# Patient Record
Sex: Female | Born: 1970 | Race: Black or African American | Hispanic: No | Marital: Single | Smoking: Current every day smoker
Health system: Southern US, Community
[De-identification: ages and names within clinical notes are randomized; demographics above are authoritative.]

## PROBLEM LIST (undated history)

## (undated) DIAGNOSIS — Z789 Other specified health status: Secondary | ICD-10-CM

## (undated) DIAGNOSIS — J302 Other seasonal allergic rhinitis: Secondary | ICD-10-CM

## (undated) DIAGNOSIS — R011 Cardiac murmur, unspecified: Secondary | ICD-10-CM

## (undated) DIAGNOSIS — L309 Dermatitis, unspecified: Secondary | ICD-10-CM

## (undated) DIAGNOSIS — M719 Bursopathy, unspecified: Secondary | ICD-10-CM

## (undated) DIAGNOSIS — D841 Defects in the complement system: Secondary | ICD-10-CM

## (undated) HISTORY — DX: Defects in the complement system: D84.1

## (undated) HISTORY — DX: Bursopathy, unspecified: M71.9

## (undated) HISTORY — PX: NO PAST SURGERIES: SHX2092

## (undated) HISTORY — DX: Cardiac murmur, unspecified: R01.1

---

## 1998-04-02 ENCOUNTER — Emergency Department (HOSPITAL_COMMUNITY): Admission: EM | Admit: 1998-04-02 | Discharge: 1998-04-02 | Payer: Self-pay | Admitting: Emergency Medicine

## 1998-05-07 ENCOUNTER — Emergency Department (HOSPITAL_COMMUNITY): Admission: EM | Admit: 1998-05-07 | Discharge: 1998-05-07 | Payer: Self-pay

## 1998-06-29 ENCOUNTER — Emergency Department (HOSPITAL_COMMUNITY): Admission: EM | Admit: 1998-06-29 | Discharge: 1998-06-29 | Payer: Self-pay | Admitting: Emergency Medicine

## 1998-08-28 ENCOUNTER — Other Ambulatory Visit: Admission: RE | Admit: 1998-08-28 | Discharge: 1998-08-28 | Payer: Self-pay | Admitting: Gynecology

## 1998-11-22 ENCOUNTER — Emergency Department (HOSPITAL_COMMUNITY): Admission: EM | Admit: 1998-11-22 | Discharge: 1998-11-22 | Payer: Self-pay | Admitting: Emergency Medicine

## 1998-11-23 ENCOUNTER — Emergency Department (HOSPITAL_COMMUNITY): Admission: EM | Admit: 1998-11-23 | Discharge: 1998-11-23 | Payer: Self-pay | Admitting: *Deleted

## 1998-12-04 ENCOUNTER — Emergency Department (HOSPITAL_COMMUNITY): Admission: EM | Admit: 1998-12-04 | Discharge: 1998-12-04 | Payer: Self-pay | Admitting: Emergency Medicine

## 1999-01-25 ENCOUNTER — Emergency Department (HOSPITAL_COMMUNITY): Admission: EM | Admit: 1999-01-25 | Discharge: 1999-01-25 | Payer: Self-pay | Admitting: *Deleted

## 1999-03-08 ENCOUNTER — Encounter: Payer: Self-pay | Admitting: Emergency Medicine

## 1999-03-08 ENCOUNTER — Emergency Department (HOSPITAL_COMMUNITY): Admission: EM | Admit: 1999-03-08 | Discharge: 1999-03-08 | Payer: Self-pay

## 1999-03-28 ENCOUNTER — Emergency Department (HOSPITAL_COMMUNITY): Admission: EM | Admit: 1999-03-28 | Discharge: 1999-03-28 | Payer: Self-pay | Admitting: *Deleted

## 1999-04-02 ENCOUNTER — Ambulatory Visit: Admission: RE | Admit: 1999-04-02 | Discharge: 1999-04-02 | Payer: Self-pay | Admitting: Pulmonary Disease

## 1999-07-09 ENCOUNTER — Emergency Department (HOSPITAL_COMMUNITY): Admission: EM | Admit: 1999-07-09 | Discharge: 1999-07-09 | Payer: Self-pay | Admitting: Emergency Medicine

## 1999-07-11 ENCOUNTER — Emergency Department (HOSPITAL_COMMUNITY): Admission: EM | Admit: 1999-07-11 | Discharge: 1999-07-11 | Payer: Self-pay | Admitting: Emergency Medicine

## 1999-08-28 ENCOUNTER — Other Ambulatory Visit: Admission: RE | Admit: 1999-08-28 | Discharge: 1999-08-28 | Payer: Self-pay | Admitting: Obstetrics and Gynecology

## 1999-09-22 ENCOUNTER — Emergency Department (HOSPITAL_COMMUNITY): Admission: EM | Admit: 1999-09-22 | Discharge: 1999-09-22 | Payer: Self-pay

## 1999-12-12 ENCOUNTER — Emergency Department (HOSPITAL_COMMUNITY): Admission: EM | Admit: 1999-12-12 | Discharge: 1999-12-12 | Payer: Self-pay | Admitting: Emergency Medicine

## 2000-03-28 ENCOUNTER — Emergency Department (HOSPITAL_COMMUNITY): Admission: EM | Admit: 2000-03-28 | Discharge: 2000-03-28 | Payer: Self-pay

## 2000-04-05 ENCOUNTER — Ambulatory Visit (HOSPITAL_COMMUNITY): Admission: RE | Admit: 2000-04-05 | Discharge: 2000-04-05 | Payer: Self-pay | Admitting: Pulmonary Disease

## 2000-04-05 ENCOUNTER — Encounter: Payer: Self-pay | Admitting: Pulmonary Disease

## 2000-08-29 ENCOUNTER — Other Ambulatory Visit: Admission: RE | Admit: 2000-08-29 | Discharge: 2000-08-29 | Payer: Self-pay | Admitting: Gynecology

## 2000-09-15 ENCOUNTER — Emergency Department (HOSPITAL_COMMUNITY): Admission: EM | Admit: 2000-09-15 | Discharge: 2000-09-15 | Payer: Self-pay | Admitting: Internal Medicine

## 2000-09-15 ENCOUNTER — Encounter: Payer: Self-pay | Admitting: Internal Medicine

## 2001-01-16 ENCOUNTER — Ambulatory Visit (HOSPITAL_COMMUNITY): Admission: RE | Admit: 2001-01-16 | Discharge: 2001-01-16 | Payer: Self-pay | Admitting: Psychiatry

## 2001-03-15 ENCOUNTER — Emergency Department (HOSPITAL_COMMUNITY): Admission: EM | Admit: 2001-03-15 | Discharge: 2001-03-15 | Payer: Self-pay | Admitting: *Deleted

## 2001-08-16 ENCOUNTER — Emergency Department (HOSPITAL_COMMUNITY): Admission: EM | Admit: 2001-08-16 | Discharge: 2001-08-16 | Payer: Self-pay | Admitting: Emergency Medicine

## 2001-08-17 ENCOUNTER — Emergency Department (HOSPITAL_COMMUNITY): Admission: EM | Admit: 2001-08-17 | Discharge: 2001-08-17 | Payer: Self-pay | Admitting: Emergency Medicine

## 2001-08-20 ENCOUNTER — Encounter: Payer: Self-pay | Admitting: Emergency Medicine

## 2001-08-20 ENCOUNTER — Emergency Department (HOSPITAL_COMMUNITY): Admission: EM | Admit: 2001-08-20 | Discharge: 2001-08-20 | Payer: Self-pay | Admitting: Emergency Medicine

## 2001-09-06 ENCOUNTER — Other Ambulatory Visit: Admission: RE | Admit: 2001-09-06 | Discharge: 2001-09-06 | Payer: Self-pay | Admitting: *Deleted

## 2001-10-09 ENCOUNTER — Emergency Department (HOSPITAL_COMMUNITY): Admission: EM | Admit: 2001-10-09 | Discharge: 2001-10-09 | Payer: Self-pay

## 2001-11-10 ENCOUNTER — Encounter: Admission: RE | Admit: 2001-11-10 | Discharge: 2001-11-10 | Payer: Self-pay | Admitting: Psychiatry

## 2001-11-21 ENCOUNTER — Encounter: Payer: Self-pay | Admitting: Emergency Medicine

## 2001-11-21 ENCOUNTER — Emergency Department (HOSPITAL_COMMUNITY): Admission: EM | Admit: 2001-11-21 | Discharge: 2001-11-21 | Payer: Self-pay | Admitting: Emergency Medicine

## 2002-01-22 ENCOUNTER — Emergency Department (HOSPITAL_COMMUNITY): Admission: EM | Admit: 2002-01-22 | Discharge: 2002-01-22 | Payer: Self-pay | Admitting: Emergency Medicine

## 2002-01-27 ENCOUNTER — Emergency Department (HOSPITAL_COMMUNITY): Admission: EM | Admit: 2002-01-27 | Discharge: 2002-01-27 | Payer: Self-pay | Admitting: *Deleted

## 2002-07-27 ENCOUNTER — Emergency Department (HOSPITAL_COMMUNITY): Admission: EM | Admit: 2002-07-27 | Discharge: 2002-07-27 | Payer: Self-pay | Admitting: Emergency Medicine

## 2002-09-07 ENCOUNTER — Other Ambulatory Visit: Admission: RE | Admit: 2002-09-07 | Discharge: 2002-09-07 | Payer: Self-pay | Admitting: Gynecology

## 2002-12-27 ENCOUNTER — Emergency Department (HOSPITAL_COMMUNITY): Admission: EM | Admit: 2002-12-27 | Discharge: 2002-12-27 | Payer: Self-pay | Admitting: Emergency Medicine

## 2003-02-16 ENCOUNTER — Emergency Department (HOSPITAL_COMMUNITY): Admission: EM | Admit: 2003-02-16 | Discharge: 2003-02-16 | Payer: Self-pay

## 2003-10-04 ENCOUNTER — Other Ambulatory Visit: Admission: RE | Admit: 2003-10-04 | Discharge: 2003-10-04 | Payer: Self-pay | Admitting: Gynecology

## 2003-10-27 ENCOUNTER — Emergency Department (HOSPITAL_COMMUNITY): Admission: EM | Admit: 2003-10-27 | Discharge: 2003-10-27 | Payer: Self-pay | Admitting: Emergency Medicine

## 2003-10-31 ENCOUNTER — Emergency Department (HOSPITAL_COMMUNITY): Admission: EM | Admit: 2003-10-31 | Discharge: 2003-10-31 | Payer: Self-pay | Admitting: Emergency Medicine

## 2004-10-07 ENCOUNTER — Other Ambulatory Visit: Admission: RE | Admit: 2004-10-07 | Discharge: 2004-10-07 | Payer: Self-pay | Admitting: Gynecology

## 2004-12-16 ENCOUNTER — Emergency Department (HOSPITAL_COMMUNITY): Admission: EM | Admit: 2004-12-16 | Discharge: 2004-12-16 | Payer: Self-pay | Admitting: Emergency Medicine

## 2004-12-18 ENCOUNTER — Emergency Department (HOSPITAL_COMMUNITY): Admission: EM | Admit: 2004-12-18 | Discharge: 2004-12-19 | Payer: Self-pay | Admitting: Emergency Medicine

## 2005-10-11 ENCOUNTER — Emergency Department (HOSPITAL_COMMUNITY): Admission: EM | Admit: 2005-10-11 | Discharge: 2005-10-11 | Payer: Self-pay | Admitting: Emergency Medicine

## 2005-10-13 ENCOUNTER — Other Ambulatory Visit: Admission: RE | Admit: 2005-10-13 | Discharge: 2005-10-13 | Payer: Self-pay | Admitting: Gynecology

## 2006-09-12 ENCOUNTER — Emergency Department (HOSPITAL_COMMUNITY): Admission: EM | Admit: 2006-09-12 | Discharge: 2006-09-13 | Payer: Self-pay | Admitting: Emergency Medicine

## 2006-10-19 ENCOUNTER — Other Ambulatory Visit: Admission: RE | Admit: 2006-10-19 | Discharge: 2006-10-19 | Payer: Self-pay | Admitting: Gynecology

## 2006-11-14 ENCOUNTER — Emergency Department (HOSPITAL_COMMUNITY): Admission: EM | Admit: 2006-11-14 | Discharge: 2006-11-14 | Payer: Self-pay | Admitting: Emergency Medicine

## 2007-03-17 ENCOUNTER — Emergency Department (HOSPITAL_COMMUNITY): Admission: EM | Admit: 2007-03-17 | Discharge: 2007-03-17 | Payer: Self-pay | Admitting: Emergency Medicine

## 2007-04-13 ENCOUNTER — Emergency Department (HOSPITAL_COMMUNITY): Admission: EM | Admit: 2007-04-13 | Discharge: 2007-04-13 | Payer: Self-pay | Admitting: Emergency Medicine

## 2007-09-28 ENCOUNTER — Emergency Department (HOSPITAL_COMMUNITY): Admission: EM | Admit: 2007-09-28 | Discharge: 2007-09-29 | Payer: Self-pay | Admitting: Emergency Medicine

## 2007-11-09 ENCOUNTER — Other Ambulatory Visit: Admission: RE | Admit: 2007-11-09 | Discharge: 2007-11-09 | Payer: Self-pay | Admitting: Gynecology

## 2008-04-01 ENCOUNTER — Ambulatory Visit (HOSPITAL_COMMUNITY): Admission: RE | Admit: 2008-04-01 | Discharge: 2008-04-01 | Payer: Self-pay | Admitting: Internal Medicine

## 2008-04-15 ENCOUNTER — Emergency Department (HOSPITAL_BASED_OUTPATIENT_CLINIC_OR_DEPARTMENT_OTHER): Admission: EM | Admit: 2008-04-15 | Discharge: 2008-04-15 | Payer: Self-pay | Admitting: Emergency Medicine

## 2008-04-19 ENCOUNTER — Emergency Department (HOSPITAL_BASED_OUTPATIENT_CLINIC_OR_DEPARTMENT_OTHER): Admission: EM | Admit: 2008-04-19 | Discharge: 2008-04-19 | Payer: Self-pay | Admitting: Emergency Medicine

## 2008-06-05 ENCOUNTER — Ambulatory Visit (HOSPITAL_COMMUNITY): Admission: RE | Admit: 2008-06-05 | Discharge: 2008-06-05 | Payer: Self-pay | Admitting: Pulmonary Disease

## 2008-06-18 ENCOUNTER — Emergency Department (HOSPITAL_BASED_OUTPATIENT_CLINIC_OR_DEPARTMENT_OTHER): Admission: EM | Admit: 2008-06-18 | Discharge: 2008-06-18 | Payer: Self-pay | Admitting: Emergency Medicine

## 2008-12-06 ENCOUNTER — Ambulatory Visit: Payer: Self-pay | Admitting: Women's Health

## 2008-12-06 ENCOUNTER — Encounter: Payer: Self-pay | Admitting: Women's Health

## 2008-12-06 ENCOUNTER — Other Ambulatory Visit: Admission: RE | Admit: 2008-12-06 | Discharge: 2008-12-06 | Payer: Self-pay | Admitting: Gynecology

## 2008-12-13 ENCOUNTER — Other Ambulatory Visit: Admission: RE | Admit: 2008-12-13 | Discharge: 2008-12-13 | Payer: Self-pay | Admitting: Gynecology

## 2008-12-13 ENCOUNTER — Ambulatory Visit: Payer: Self-pay | Admitting: Women's Health

## 2008-12-13 ENCOUNTER — Ambulatory Visit (HOSPITAL_COMMUNITY): Admission: RE | Admit: 2008-12-13 | Discharge: 2008-12-13 | Payer: Self-pay | Admitting: Gastroenterology

## 2008-12-16 ENCOUNTER — Ambulatory Visit (HOSPITAL_COMMUNITY): Admission: RE | Admit: 2008-12-16 | Discharge: 2008-12-16 | Payer: Self-pay | Admitting: Gastroenterology

## 2009-03-18 ENCOUNTER — Ambulatory Visit: Payer: Self-pay | Admitting: Diagnostic Radiology

## 2009-03-18 ENCOUNTER — Emergency Department (HOSPITAL_BASED_OUTPATIENT_CLINIC_OR_DEPARTMENT_OTHER): Admission: EM | Admit: 2009-03-18 | Discharge: 2009-03-18 | Payer: Self-pay | Admitting: Emergency Medicine

## 2009-03-18 ENCOUNTER — Emergency Department (HOSPITAL_COMMUNITY): Admission: EM | Admit: 2009-03-18 | Discharge: 2009-03-18 | Payer: Self-pay | Admitting: Emergency Medicine

## 2009-03-19 ENCOUNTER — Emergency Department (HOSPITAL_BASED_OUTPATIENT_CLINIC_OR_DEPARTMENT_OTHER): Admission: EM | Admit: 2009-03-19 | Discharge: 2009-03-19 | Payer: Self-pay | Admitting: Emergency Medicine

## 2009-03-24 DIAGNOSIS — D841 Defects in the complement system: Secondary | ICD-10-CM

## 2009-03-24 HISTORY — DX: Defects in the complement system: D84.1

## 2009-05-05 ENCOUNTER — Ambulatory Visit: Payer: Self-pay | Admitting: Women's Health

## 2009-11-19 ENCOUNTER — Ambulatory Visit (HOSPITAL_COMMUNITY): Admission: RE | Admit: 2009-11-19 | Discharge: 2009-11-19 | Payer: Self-pay | Admitting: Gynecology

## 2009-12-20 ENCOUNTER — Emergency Department (HOSPITAL_COMMUNITY): Admission: EM | Admit: 2009-12-20 | Discharge: 2009-12-20 | Payer: Self-pay | Admitting: Emergency Medicine

## 2009-12-22 ENCOUNTER — Emergency Department (HOSPITAL_BASED_OUTPATIENT_CLINIC_OR_DEPARTMENT_OTHER): Admission: EM | Admit: 2009-12-22 | Discharge: 2009-12-22 | Payer: Self-pay | Admitting: Emergency Medicine

## 2009-12-22 ENCOUNTER — Ambulatory Visit: Payer: Self-pay | Admitting: Interventional Radiology

## 2010-01-30 ENCOUNTER — Emergency Department (HOSPITAL_BASED_OUTPATIENT_CLINIC_OR_DEPARTMENT_OTHER): Admission: EM | Admit: 2010-01-30 | Discharge: 2010-01-30 | Payer: Self-pay | Admitting: Emergency Medicine

## 2010-08-06 LAB — COMPREHENSIVE METABOLIC PANEL
AST: 24 U/L (ref 0–37)
Albumin: 4.6 g/dL (ref 3.5–5.2)
Alkaline Phosphatase: 89 U/L (ref 39–117)
CO2: 28 mEq/L (ref 19–32)
GFR calc Af Amer: >60 mL/min (ref >60)
GFR calc non Af Amer: >60 mL/min (ref >60)
Sodium: 138 mEq/L (ref 135–145)

## 2010-08-06 LAB — CBC
HCT: 49.3 % — ABNORMAL HIGH (ref 36.0–46.0)
Hemoglobin: 16.8 g/dL — ABNORMAL HIGH (ref 12.0–15.0)
MCHC: 34.1 g/dL (ref 30.0–36.0)
MCV: 104.3 fL — ABNORMAL HIGH (ref 78.0–100.0)
Platelets: 199 10*3/uL (ref 150–400)
RBC: 4.72 MIL/uL (ref 3.87–5.11)
RDW: 13.1 % (ref 11.5–15.5)

## 2010-08-06 LAB — DIFFERENTIAL
Basophils Relative: 1 % (ref 0–1)
Eosinophils Absolute: 0.1 10*3/uL (ref 0.0–0.7)
Lymphs Abs: 1.4 10*3/uL (ref 0.7–4.0)
Monocytes Absolute: 1 10*3/uL (ref 0.1–1.0)
Neutro Abs: 5 10*3/uL (ref 1.7–7.7)
Neutrophils Relative %: 68 % (ref 43–77)

## 2010-08-06 LAB — URINALYSIS, ROUTINE W REFLEX MICROSCOPIC
Leukocytes, UA: NEGATIVE
Nitrite: NEGATIVE
Protein, ur: 100 mg/dL — AB
Urobilinogen, UA: 1 mg/dL (ref 0.0–1.0)

## 2010-08-06 LAB — URINE MICROSCOPIC-ADD ON

## 2010-08-07 LAB — URINALYSIS, ROUTINE W REFLEX MICROSCOPIC
Glucose, UA: NEGATIVE mg/dL
Leukocytes, UA: NEGATIVE
Nitrite: NEGATIVE
pH: 7 (ref 5.0–8.0)

## 2010-08-07 LAB — COMPREHENSIVE METABOLIC PANEL
ALT: 15 U/L (ref 0–35)
AST: 21 U/L (ref 0–37)
Albumin: 4.2 g/dL (ref 3.5–5.2)
CO2: 28 mEq/L (ref 19–32)
Chloride: 101 mEq/L (ref 96–112)
Creatinine, Ser: 0.6 mg/dL (ref 0.4–1.2)
GFR calc non Af Amer: >60 mL/min (ref >60)
Glucose, Bld: 83 mg/dL (ref 70–99)
Potassium: 3.9 mEq/L (ref 3.5–5.1)
Total Protein: 8.2 g/dL (ref 6.0–8.3)

## 2010-08-07 LAB — URINE MICROSCOPIC-ADD ON

## 2010-08-07 LAB — LIPASE, BLOOD: Lipase: 39 U/L (ref 23–300)

## 2010-08-08 LAB — POCT I-STAT, CHEM 8
Glucose, Bld: 123 mg/dL — ABNORMAL HIGH (ref 70–99)
HCT: 47 % — ABNORMAL HIGH (ref 36.0–46.0)
Hemoglobin: 16 g/dL — ABNORMAL HIGH (ref 12.0–15.0)
Potassium: 3.6 mEq/L (ref 3.5–5.1)
Sodium: 143 mEq/L (ref 135–145)
TCO2: 23 mmol/L (ref 0–100)

## 2010-08-08 LAB — URINALYSIS, ROUTINE W REFLEX MICROSCOPIC
Hgb urine dipstick: NEGATIVE
Ketones, ur: >80 mg/dL — AB
Nitrite: NEGATIVE
Specific Gravity, Urine: 1.017 (ref 1.005–1.030)

## 2010-08-08 LAB — URINE MICROSCOPIC-ADD ON

## 2010-08-12 ENCOUNTER — Emergency Department (HOSPITAL_BASED_OUTPATIENT_CLINIC_OR_DEPARTMENT_OTHER)
Admission: EM | Admit: 2010-08-12 | Discharge: 2010-08-12 | Disposition: A | Discharge status: Home / Self Care | Payer: Federal, State, Local not specified - PPO | Attending: Emergency Medicine | Admitting: Emergency Medicine

## 2010-08-12 DIAGNOSIS — J45909 Unspecified asthma, uncomplicated: Secondary | ICD-10-CM | POA: Insufficient documentation

## 2010-08-12 DIAGNOSIS — R112 Nausea with vomiting, unspecified: Secondary | ICD-10-CM | POA: Insufficient documentation

## 2010-08-12 LAB — URINALYSIS, ROUTINE W REFLEX MICROSCOPIC
Glucose, UA: NEGATIVE mg/dL
Ketones, ur: >80 mg/dL — AB
Leukocytes, UA: NEGATIVE
Nitrite: NEGATIVE
pH: 8.5 — ABNORMAL HIGH (ref 5.0–8.0)

## 2010-08-12 LAB — DIFFERENTIAL
Basophils Relative: 0 % (ref 0–1)
Eosinophils Absolute: 0 10*3/uL (ref 0.0–0.7)
Eosinophils Relative: 0 % (ref 0–5)
Lymphs Abs: 0.8 10*3/uL (ref 0.7–4.0)
Monocytes Absolute: 0.5 10*3/uL (ref 0.1–1.0)
Monocytes Relative: 4 % (ref 3–12)
Neutrophils Relative %: 89 % — ABNORMAL HIGH (ref 43–77)

## 2010-08-12 LAB — CBC
MCH: 35.7 pg — ABNORMAL HIGH (ref 26.0–34.0)
MCHC: 35.8 g/dL (ref 30.0–36.0)
MCV: 99.7 fL (ref 78.0–100.0)
Platelets: 194 10*3/uL (ref 150–400)
RBC: 3.92 MIL/uL (ref 3.87–5.11)
RDW: 11.8 % (ref 11.5–15.5)

## 2010-08-12 LAB — COMPREHENSIVE METABOLIC PANEL
AST: 32 U/L (ref 0–37)
Albumin: 5 g/dL (ref 3.5–5.2)
BUN: 9 mg/dL (ref 6–23)
Calcium: 9.9 mg/dL (ref 8.4–10.5)
Chloride: 109 mEq/L (ref 96–112)
Creatinine, Ser: 0.6 mg/dL (ref 0.4–1.2)
GFR calc Af Amer: >60 mL/min (ref >60)
Total Bilirubin: 1.8 mg/dL — ABNORMAL HIGH (ref 0.3–1.2)
Total Protein: 9 g/dL — ABNORMAL HIGH (ref 6.0–8.3)

## 2010-08-12 LAB — URINE MICROSCOPIC-ADD ON

## 2010-08-12 LAB — PREGNANCY, URINE: Preg Test, Ur: NEGATIVE

## 2010-08-14 ENCOUNTER — Emergency Department (HOSPITAL_BASED_OUTPATIENT_CLINIC_OR_DEPARTMENT_OTHER)
Admission: EM | Admit: 2010-08-14 | Discharge: 2010-08-14 | Disposition: A | Discharge status: Home / Self Care | Payer: Federal, State, Local not specified - PPO | Attending: Emergency Medicine | Admitting: Emergency Medicine

## 2010-08-14 ENCOUNTER — Emergency Department (INDEPENDENT_AMBULATORY_CARE_PROVIDER_SITE_OTHER): Payer: Federal, State, Local not specified - PPO

## 2010-08-14 DIAGNOSIS — J45909 Unspecified asthma, uncomplicated: Secondary | ICD-10-CM | POA: Insufficient documentation

## 2010-08-14 DIAGNOSIS — R112 Nausea with vomiting, unspecified: Secondary | ICD-10-CM | POA: Insufficient documentation

## 2010-08-14 LAB — CBC
MCH: 35.3 pg — ABNORMAL HIGH (ref 26.0–34.0)
MCV: 97.9 fL (ref 78.0–100.0)
Platelets: 208 10*3/uL (ref 150–400)
RDW: 11.9 % (ref 11.5–15.5)
WBC: 10.7 10*3/uL — ABNORMAL HIGH (ref 4.0–10.5)

## 2010-08-14 LAB — COMPREHENSIVE METABOLIC PANEL
AST: 42 U/L — ABNORMAL HIGH (ref 0–37)
Albumin: 5.6 g/dL — ABNORMAL HIGH (ref 3.5–5.2)
BUN: 11 mg/dL (ref 6–23)
Creatinine, Ser: 0.6 mg/dL (ref 0.4–1.2)
GFR calc Af Amer: >60 mL/min (ref >60)
Total Protein: 10.1 g/dL — ABNORMAL HIGH (ref 6.0–8.3)

## 2010-08-14 LAB — DIFFERENTIAL
Basophils Relative: 0 % (ref 0–1)
Eosinophils Absolute: 0 10*3/uL (ref 0.0–0.7)
Eosinophils Relative: 0 % (ref 0–5)
Lymphs Abs: 1.4 10*3/uL (ref 0.7–4.0)
Monocytes Relative: 8 % (ref 3–12)

## 2010-08-14 MED ORDER — IOHEXOL 300 MG/ML  SOLN
100.0000 mL | Freq: Once | INTRAMUSCULAR | Status: AC | PRN
  Administered 2010-08-14: 100 mL via INTRAVENOUS

## 2011-01-21 DIAGNOSIS — M719 Bursopathy, unspecified: Secondary | ICD-10-CM | POA: Insufficient documentation

## 2011-01-21 DIAGNOSIS — J45901 Unspecified asthma with (acute) exacerbation: Secondary | ICD-10-CM | POA: Insufficient documentation

## 2011-01-21 DIAGNOSIS — J45909 Unspecified asthma, uncomplicated: Secondary | ICD-10-CM | POA: Insufficient documentation

## 2011-01-28 ENCOUNTER — Ambulatory Visit (INDEPENDENT_AMBULATORY_CARE_PROVIDER_SITE_OTHER): Payer: Federal, State, Local not specified - PPO | Admitting: Women's Health

## 2011-01-28 ENCOUNTER — Encounter: Payer: Self-pay | Admitting: Women's Health

## 2011-01-28 ENCOUNTER — Other Ambulatory Visit (HOSPITAL_COMMUNITY)
Admission: RE | Admit: 2011-01-28 | Discharge: 2011-01-28 | Disposition: A | Discharge status: Home / Self Care | Payer: Federal, State, Local not specified - PPO | Source: Ambulatory Visit | Attending: Obstetrics and Gynecology | Admitting: Obstetrics and Gynecology

## 2011-01-28 VITALS — BP 110/70 | Ht 64.25 in | Wt 147.0 lb

## 2011-01-28 DIAGNOSIS — Z01419 Encounter for gynecological examination (general) (routine) without abnormal findings: Secondary | ICD-10-CM

## 2011-01-28 NOTE — Progress Notes (Signed)
Barbara Espinoza April 26, 1971 [id number]    History:    The patient presents for annual exam.  Works for the [employer], finishing up BS degree.   Past medical history, past surgical history, family history and social history were all reviewed and documented in the EPIC chart.   ROS:  A  ROS was performed and pertinent positives and negatives are included in the history.  Exam:  Filed Vitals:   01/28/11 0927  BP: 110/70    General appearance:  Normal Head/Neck:  Normal, without cervical or supraclavicular adenopathy. Thyroid:  Symmetrical, normal in size, without palpable masses or nodularity. Respiratory  Effort:  Normal  Auscultation:  Clear without wheezing or rhonchi Cardiovascular  Auscultation:  Regular rate, without rubs, murmurs or gallops  Edema/varicosities:  Not grossly evident Abdominal  Soft,nontender, without masses, guarding or rebound.  Liver/spleen:  No organomegaly noted  Hernia:  None appreciated  Skin  Inspection:  Grossly normal  Palpation:  Grossly normal Neurologic/psychiatric  Orientation:  Normal with appropriate conversation.  Mood/affect:  Normal  Genitourinary    Breasts: Examined lying and sitting.     Right: Without masses, retractions, discharge or axillary adenopathy.     Left: Without masses, retractions, discharge or axillary adenopathy.   Inguinal/mons:  Normal without inguinal adenopathy  External genitalia:  Normal  BUS/Urethra/Skene's glands:  Normal  Bladder:  Normal  Vagina:  Normal  Cervix:  Normal  Uterus:   normal in size, shape and contour.  Midline and mobile  Adnexa/parametria:     Rt: Without masses or tenderness.   Lt: Without masses or tenderness.  Anus and perineum: Normal  Digital rectal exam: Normal sphincter tone without palpated masses or tenderness  Assessment/Plan:  40 y.o. SBF G0  for annual exam. Monthly 5 day cycle without complaint, not sexually active. Encouraged condoms when she becomes. Labs are done  at her primary care, Pap only today SBEs, she had a normal mammogram last year, instructed to schedule on now is due. Encouraged exercise, MVI, calcium rich diet,    Barbara Espinoza WHNP, 10:00 AM 01/28/2011

## 2011-02-07 ENCOUNTER — Emergency Department (INDEPENDENT_AMBULATORY_CARE_PROVIDER_SITE_OTHER): Payer: Federal, State, Local not specified - PPO

## 2011-02-07 ENCOUNTER — Encounter (HOSPITAL_BASED_OUTPATIENT_CLINIC_OR_DEPARTMENT_OTHER): Payer: Self-pay | Admitting: *Deleted

## 2011-02-07 ENCOUNTER — Emergency Department (HOSPITAL_BASED_OUTPATIENT_CLINIC_OR_DEPARTMENT_OTHER)
Admission: EM | Admit: 2011-02-07 | Discharge: 2011-02-07 | Disposition: A | Discharge status: Home / Self Care | Payer: Federal, State, Local not specified - PPO | Attending: Emergency Medicine | Admitting: Emergency Medicine

## 2011-02-07 DIAGNOSIS — R05 Cough: Secondary | ICD-10-CM

## 2011-02-07 DIAGNOSIS — R0989 Other specified symptoms and signs involving the circulatory and respiratory systems: Secondary | ICD-10-CM

## 2011-02-07 DIAGNOSIS — R062 Wheezing: Secondary | ICD-10-CM

## 2011-02-07 DIAGNOSIS — J45901 Unspecified asthma with (acute) exacerbation: Secondary | ICD-10-CM

## 2011-02-07 DIAGNOSIS — R059 Cough, unspecified: Secondary | ICD-10-CM

## 2011-02-07 MED ORDER — ALBUTEROL SULFATE (5 MG/ML) 0.5% IN NEBU
INHALATION_SOLUTION | RESPIRATORY_TRACT | Status: AC
  Administered 2011-02-07: 5 mg
  Filled 2011-02-07: qty 1

## 2011-02-07 MED ORDER — PREDNISONE 10 MG PO TABS: 40.0000 mg | ORAL_TABLET | Freq: Every day | ORAL | Status: AC

## 2011-02-07 MED ORDER — PREDNISONE 20 MG PO TABS
60.0000 mg | ORAL_TABLET | Freq: Once | ORAL | Status: AC
  Administered 2011-02-07: 60 mg via ORAL
  Filled 2011-02-07: qty 3

## 2011-02-07 MED ORDER — IPRATROPIUM BROMIDE 0.02 % IN SOLN
RESPIRATORY_TRACT | Status: AC
  Administered 2011-02-07: 0.5 mg
  Filled 2011-02-07: qty 2.5

## 2011-02-07 NOTE — ED Provider Notes (Signed)
History     CSN: [id number] Arrival date & time: 02/07/2011  1:56 PM   Chief Complaint  Patient presents with  . Asthma     (Include location/radiation/quality/duration/timing/severity/associated sxs/prior treatment) HPI Comments: Pt states that her inhaler was not helping at home  Patient is a 40 y.o. female presenting with wheezing. The history is provided by the patient. No language interpreter was used.  Wheezing  The current episode started yesterday. The onset was gradual. The problem occurs continuously. The problem has been unchanged. The problem is moderate. The symptoms are relieved by beta-agonist inhalers. The symptoms are aggravated by nothing. Associated symptoms include wheezing. There was no intake of a foreign body. The Heimlich maneuver was not attempted.     Past Medical History  Diagnosis Date  . Asthma   . Bursitis     RIGHT KNEE  . 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 Heart murmur      History reviewed. No pertinent past surgical history.  Family History  Problem Relation Age of Onset  . Cancer Mother     COLON CANCER--MOM W MULTIPLE MYELOMA/HTN 2010-LIVES IN [place]  . Hypertension Mother   . Multiple myeloma Mother     History  Substance Use Topics  . Smoking status: Never Smoker   . Smokeless tobacco: Never Used  . Alcohol Use: Yes    OB History    Grav Para Term Preterm Abortions TAB SAB Ect Mult Living   0 0              Review of Systems  Respiratory: Positive for wheezing.   All other systems reviewed and are negative.    Allergies  Peanut-containing drug products and Shellfish allergy  Home Medications   Current Outpatient Rx  Name Route Sig Dispense Refill  . ALBUTEROL IN Inhalation Inhale into the lungs.      . MULTIVITAMIN/IRON PO TABS Oral Take by mouth.        Physical Exam    BP 102/54  Pulse 56  Temp(Src) 98.5 F (36.9 C) (Oral)  Resp 20  SpO2 100%  LMP 01/13/2011  Physical Exam    Nursing note and vitals reviewed. Constitutional: She is oriented to person, place, and time. She appears well-developed and well-nourished.  HENT:  Head: Normocephalic and atraumatic.  Cardiovascular: Normal rate and regular rhythm.   Pulmonary/Chest: She has rales.  Musculoskeletal: Normal range of motion.  Neurological: She is alert and oriented to person, place, and time.  Skin: Skin is warm and dry.    ED Course  Procedures   Dg Chest 2 View  02/07/2011  *RADIOLOGY REPORT*  Clinical Data: Wheezing, cough, congestion  CHEST - 2 VIEW  Comparison: 09/28/2007; 09/12/2006  Findings: Unchanged cardiac silhouette and mediastinal contours. No focal parenchymal opacities.  No pleural effusion or pneumothorax.  Unchanged bones.  IMPRESSION: No acute cardiopulmonary disease.  Specifically, no pneumonia.  Original Report Authenticated By: Waynard Reeds, M.D.     No diagnosis found.   MDM Pt is feeling better after the treatment:will treat for asthma exacerbation       Teressa Lower, NP 02/07/11 1550

## 2011-02-07 NOTE — ED Notes (Signed)
Patient is resting comfortably.No wheezing RT at side Pt denies any SOB

## 2011-02-07 NOTE — ED Notes (Signed)
Care plan and use of meds reviewed with pt will return as needed for Treatment

## 2011-02-07 NOTE — ED Notes (Signed)
Pt has hx of asthma and this particular episode started Sat a.m. Has tried inhaler without relief. O2 Sat 100% at triage. Tim, RRT to triage to assess.

## 2011-02-09 NOTE — ED Provider Notes (Signed)
History/physical exam/procedure(s) were performed by non-physician practitioner and as supervising physician I was immediately available for consultation/collaboration. I have reviewed all notes and am in agreement with care and plan.   Hilario Quarry, MD 02/09/11 1201

## 2011-02-10 ENCOUNTER — Encounter (HOSPITAL_BASED_OUTPATIENT_CLINIC_OR_DEPARTMENT_OTHER): Payer: Self-pay | Admitting: Emergency Medicine

## 2011-02-10 ENCOUNTER — Emergency Department (HOSPITAL_BASED_OUTPATIENT_CLINIC_OR_DEPARTMENT_OTHER)
Admission: EM | Admit: 2011-02-10 | Discharge: 2011-02-10 | Disposition: A | Discharge status: Home / Self Care | Payer: Federal, State, Local not specified - PPO | Attending: Emergency Medicine | Admitting: Emergency Medicine

## 2011-02-10 DIAGNOSIS — J45909 Unspecified asthma, uncomplicated: Secondary | ICD-10-CM | POA: Insufficient documentation

## 2011-02-10 MED ORDER — IPRATROPIUM BROMIDE 0.02 % IN SOLN
RESPIRATORY_TRACT | Status: AC
  Administered 2011-02-10: 0.5 mg
  Filled 2011-02-10: qty 2.5

## 2011-02-10 MED ORDER — ALBUTEROL SULFATE (5 MG/ML) 0.5% IN NEBU
INHALATION_SOLUTION | RESPIRATORY_TRACT | Status: AC
  Filled 2011-02-10: qty 1

## 2011-02-10 MED ORDER — AZITHROMYCIN 250 MG PO TABS: 250.0000 mg | ORAL_TABLET | Freq: Every day | ORAL | Status: AC

## 2011-02-10 MED ORDER — ALBUTEROL SULFATE (5 MG/ML) 0.5% IN NEBU
5.0000 mg | INHALATION_SOLUTION | Freq: Once | RESPIRATORY_TRACT | Status: AC
  Administered 2011-02-10: 5 mg via RESPIRATORY_TRACT

## 2011-02-10 MED ORDER — IPRATROPIUM BROMIDE 0.02 % IN SOLN
0.5000 mg | Freq: Once | RESPIRATORY_TRACT | Status: AC
  Administered 2011-02-10: 0.5 mg via RESPIRATORY_TRACT

## 2011-02-10 NOTE — ED Provider Notes (Signed)
Medical screening examination/treatment/procedure(s) were performed by non-physician practitioner and as supervising physician I was immediately available for consultation/collaboration.   Charles B. Bernette Mayers, MD 02/10/11 [phone]

## 2011-02-10 NOTE — ED Notes (Signed)
Albuterol 5mg   and Atrovent 0.5mg  given to pt.  Pt states that she is allergic to peanuts however she does take Atrovent Nebs with no reaction.  No complications noted during tx nor after tx.

## 2011-02-10 NOTE — ED Provider Notes (Signed)
History     CSN: [id number] Arrival date & time: 02/10/2011  3:48 PM   Chief Complaint  Patient presents with  . Asthma     (Include location/radiation/quality/duration/timing/severity/associated sxs/prior treatment) HPI Comments: Pt states that she was seen 3 days ago and doesn't necessarily feel like she is getting better:pt states that she used her neb yesterday  Patient is a 40 y.o. female presenting with wheezing. The history is provided by the patient. No language interpreter was used.  Wheezing  The current episode started more than 1 week ago. The onset was gradual. The problem occurs continuously. The problem has been unchanged. The problem is moderate. The symptoms are relieved by beta-agonist inhalers. The symptoms are aggravated by nothing. Associated symptoms include cough, shortness of breath and wheezing. Pertinent negatives include no fever. There was no intake of a foreign body. The intake occurred while eating. The Heimlich maneuver was not attempted. She was not exposed to toxic fumes. She has not inhaled smoke recently. She has had no prior hospitalizations. She has had no prior ICU admissions. She has had no prior intubations.     Past Medical History  Diagnosis Date  . Asthma   . Bursitis     RIGHT KNEE  . 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 Heart murmur      History reviewed. No pertinent past surgical history.  Family History  Problem Relation Age of Onset  . Cancer Mother     COLON CANCER--MOM W MULTIPLE MYELOMA/HTN 2010-LIVES IN [place]  . Hypertension Mother   . Multiple myeloma Mother     History  Substance Use Topics  . Smoking status: Never Smoker   . Smokeless tobacco: Never Used  . Alcohol Use: Yes    OB History    Grav Para Term Preterm Abortions TAB SAB Ect Mult Living   0 0              Review of Systems  Constitutional: Negative for fever.  Respiratory: Positive for cough, shortness of breath and  wheezing.   All other systems reviewed and are negative.    Allergies  Peanut-containing drug products and Shellfish allergy  Home Medications   Current Outpatient Rx  Name Route Sig Dispense Refill  . ALBUTEROL SULFATE HFA 108 (90 BASE) MCG/ACT IN AERS Inhalation Inhale 2 puffs into the lungs every 4 (four) hours as needed. Shortness of breath and wheezing     . PREDNISONE 10 MG PO TABS Oral Take 4 tablets (40 mg total) by mouth daily. 20 tablet 0    Physical Exam    BP 110/53  Pulse 51  Temp(Src) 98.3 F (36.8 C) (Oral)  Resp 18  SpO2 100%  LMP 02/08/2011  Physical Exam  Nursing note and vitals reviewed. Constitutional: She is oriented to person, place, and time. She appears well-developed and well-nourished.  Cardiovascular: Normal rate and regular rhythm.   Pulmonary/Chest: Effort normal. She has wheezes.  Musculoskeletal: Normal range of motion.  Neurological: She is alert and oriented to person, place, and time.    ED Course  Procedures       No diagnosis found.   MDM Pt is feeling better after treatment:will place on antibiotics as pt symptoms continuing:discussed with pt appropriate use of nebs at home       Teressa Lower, NP 02/10/11 1725

## 2011-02-10 NOTE — ED Notes (Signed)
Pt c/o asthma exacerbation- was seen here Sunday for same, no improvement (rx for prednisone)

## 2011-02-23 LAB — BASIC METABOLIC PANEL
CO2: 28
Calcium: 10.2
Chloride: 102
GFR calc Af Amer: >60
Potassium: 3.4 — ABNORMAL LOW
Sodium: 144

## 2011-02-23 LAB — URINALYSIS, ROUTINE W REFLEX MICROSCOPIC
Glucose, UA: NEGATIVE
Leukocytes, UA: NEGATIVE
pH: 8

## 2011-02-23 LAB — URINE MICROSCOPIC-ADD ON

## 2011-03-02 LAB — POCT PREGNANCY, URINE
Operator id: 231701
Preg Test, Ur: NEGATIVE

## 2011-03-03 LAB — COMPREHENSIVE METABOLIC PANEL WITH GFR
AST: 18
Albumin: 4.2
Alkaline Phosphatase: 61
BUN: 7
CO2: 25
Chloride: 106
Creatinine, Ser: 0.84
GFR calc Af Amer: >60
GFR calc non Af Amer: >60
Potassium: 3.4 — ABNORMAL LOW
Total Bilirubin: 1.8 — ABNORMAL HIGH

## 2011-03-03 LAB — LIPASE, BLOOD: Lipase: 12

## 2011-03-03 LAB — DIFFERENTIAL
Basophils Absolute: 0
Basophils Relative: 0
Eosinophils Absolute: 0
Eosinophils Relative: 0
Lymphocytes Relative: 6 — ABNORMAL LOW
Lymphs Abs: 0.6 — ABNORMAL LOW
Monocytes Absolute: 0.2
Monocytes Relative: 2 — ABNORMAL LOW
Neutro Abs: 9.1 — ABNORMAL HIGH
Neutrophils Relative %: 92 — ABNORMAL HIGH

## 2011-03-03 LAB — URINALYSIS, ROUTINE W REFLEX MICROSCOPIC
Bilirubin Urine: NEGATIVE
Glucose, UA: NEGATIVE
Hgb urine dipstick: NEGATIVE
Ketones, ur: >80 — AB
Leukocytes, UA: NEGATIVE
Nitrite: NEGATIVE
Protein, ur: 100 — AB
Specific Gravity, Urine: 1.027
Urobilinogen, UA: 0.2
pH: 8

## 2011-03-03 LAB — CBC
HCT: 41.4
Hemoglobin: 14.2
MCHC: 34.3
MCV: 101.5 — ABNORMAL HIGH
Platelets: 243
RBC: 4.08
RDW: 12.8
WBC: 9.9

## 2011-03-03 LAB — URINE MICROSCOPIC-ADD ON

## 2011-03-03 LAB — POCT PREGNANCY, URINE
Operator id: 253041
Preg Test, Ur: NEGATIVE

## 2011-03-03 LAB — COMPREHENSIVE METABOLIC PANEL
ALT: 14
Calcium: 9.7
Glucose, Bld: 146 — ABNORMAL HIGH
Sodium: 142
Total Protein: 7.6

## 2011-07-08 ENCOUNTER — Ambulatory Visit (INDEPENDENT_AMBULATORY_CARE_PROVIDER_SITE_OTHER): Payer: Federal, State, Local not specified - PPO | Admitting: Women's Health

## 2011-07-08 ENCOUNTER — Encounter: Payer: Self-pay | Admitting: Women's Health

## 2011-07-08 DIAGNOSIS — Z309 Encounter for contraceptive management, unspecified: Secondary | ICD-10-CM

## 2011-07-08 DIAGNOSIS — IMO0001 Reserved for inherently not codable concepts without codable children: Secondary | ICD-10-CM

## 2011-07-08 MED ORDER — NORETHINDRONE 0.35 MG PO TABS: 1.0000 | ORAL_TABLET | Freq: Every day | ORAL | Status: DC

## 2011-07-08 NOTE — Progress Notes (Signed)
Patient ID: Barbara Espinoza, female   DOB: 1970/09/03, 41 y.o.   MRN: [id number] Presents to discuss contraception. History of combination birth control pill use in the past, questionable diagnosis of herititary angioedema  November  2010/No estrogen products. Has not been sexually active in greater than 2 years.  Plan: Reviewed importance of no estrogen containing products due to angioedema history. Options reviewed, IUDs, nexplanon,  progestin only pills. Micronor 1 by mouth daily start with first day of cycle, taking at about the same time, and reviewed no placebos days. Encouraged condoms especially first month and for infection control. Discussed obtaining negative STD screening for partner. Instructed to call if problems with spotting.

## 2011-07-14 ENCOUNTER — Telehealth: Payer: Self-pay | Admitting: *Deleted

## 2011-07-14 NOTE — Telephone Encounter (Signed)
Pt called wanting to know if she could have medication to help bring her period on much quicker, her period is due this upcoming weekend. I told pt that she would needed to call at least a month in advance to get medication to help with this.

## 2011-08-09 ENCOUNTER — Other Ambulatory Visit: Payer: Self-pay | Admitting: *Deleted

## 2011-08-09 DIAGNOSIS — IMO0001 Reserved for inherently not codable concepts without codable children: Secondary | ICD-10-CM

## 2011-08-09 MED ORDER — NORETHINDRONE 0.35 MG PO TABS: 1.0000 | ORAL_TABLET | Freq: Every day | ORAL | Status: DC

## 2011-08-09 NOTE — Progress Notes (Signed)
Pharmacy requested 90 d rx on oc's

## 2011-08-30 ENCOUNTER — Telehealth: Payer: Self-pay | Admitting: *Deleted

## 2011-08-30 NOTE — Telephone Encounter (Signed)
Pt is currently taking Micronor 1 by mouth daily since feb.. Pt c/o spotting while on pills, pt said started pills to help regulate her period, but with the spotting it is very hard to pin point when her cycle starts. Pt would like to speak with you about switching pills. Please advise

## 2011-08-30 NOTE — Telephone Encounter (Signed)
Second month of micronor. Questionable history of heritidary angioedema should use no estrogen products. IUD's reviewed.  Will think about Mirena.  Will continue on Micronor, again reviewed importance of taking daily no placebo week.

## 2012-08-30 ENCOUNTER — Other Ambulatory Visit (HOSPITAL_COMMUNITY)
Admission: RE | Admit: 2012-08-30 | Discharge: 2012-08-30 | Disposition: A | Discharge status: Home / Self Care | Payer: Federal, State, Local not specified - PPO | Source: Ambulatory Visit | Attending: Obstetrics and Gynecology | Admitting: Obstetrics and Gynecology

## 2012-08-30 ENCOUNTER — Ambulatory Visit (INDEPENDENT_AMBULATORY_CARE_PROVIDER_SITE_OTHER): Payer: Federal, State, Local not specified - PPO | Admitting: Women's Health

## 2012-08-30 ENCOUNTER — Encounter: Payer: Self-pay | Admitting: Women's Health

## 2012-08-30 VITALS — BP 112/68 | Ht 65.0 in | Wt 166.0 lb

## 2012-08-30 DIAGNOSIS — Z01419 Encounter for gynecological examination (general) (routine) without abnormal findings: Secondary | ICD-10-CM | POA: Insufficient documentation

## 2012-08-30 DIAGNOSIS — Z833 Family history of diabetes mellitus: Secondary | ICD-10-CM

## 2012-08-30 DIAGNOSIS — Z113 Encounter for screening for infections with a predominantly sexual mode of transmission: Secondary | ICD-10-CM

## 2012-08-30 NOTE — Progress Notes (Addendum)
Barbara Espinoza Feb 18, 1971 [id number]    History:    The patient presents for annual exam.  Monthly cycle/new partner. History of normal Paps. Mammogram overdue, normal  2011. History of questionable hereditary angioedema/2010no estrogen products no other treatment. Had been on OCs greater than 10 years without problem. Had tried Micronor in past but had irregular bleeding and stopped. Negative colonoscopy 2002.  Past medical history, past surgical history, family history and social history were all reviewed and documented in the EPIC chart. 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 Mother- multiple myeloma, colon cancer and hypertension/lives in [place].    ROS:  A  ROS was performed and pertinent positives and negatives are included in the history.  Exam:  Filed Vitals:   08/30/12 0946  BP: 112/68    General appearance:  Normal Head/Neck:  Normal, without cervical or supraclavicular adenopathy. Thyroid:  Symmetrical, normal in size, without palpable masses or nodularity. Respiratory  Effort:  Normal  Auscultation:  Clear without wheezing or rhonchi Cardiovascular  Auscultation:  Regular rate, without rubs, murmurs or gallops  Edema/varicosities:  Not grossly evident Abdominal  Soft,nontender, without masses, guarding or rebound.  Liver/spleen:  No organomegaly noted  Hernia:  None appreciated  Skin  Inspection:  Grossly normal  Palpation:  Grossly normal Neurologic/psychiatric  Orientation:  Normal with appropriate conversation.  Mood/affect:  Normal  Genitourinary    Breasts: Examined lying and sitting.     Right: Without masses, retractions, discharge or axillary adenopathy.     Left: Without masses, retractions, discharge or axillary adenopathy.   Inguinal/mons:  Normal without inguinal adenopathy  External genitalia:  Normal  BUS/Urethra/Skene's glands:  Normal  Bladder:  Normal  Vagina:  Normal  Cervix:  Normal  Uterus:   normal in size,  shape and contour.  Midline and mobile  Adnexa/parametria:     Rt: Without masses or tenderness.   Lt: Without masses or tenderness.  Anus and perineum: Normal  Digital rectal exam: Normal sphincter tone without palpated masses or tenderness  Assessment/Plan:  42 y.o. SBF  G0  for annual exam.     STD screen Questionable hereditary angioedema Asthma-inhaler primary care  Plan: Contraception reviewed, declines will continue with condoms. Aware to use no estrogen containing products. SBE's, importance of an annual mammogram reviewed, instructed to schedule. Has gained 20 pounds, reviewed importance of decreasing calories and increasing exercise for weight loss for health. Calcium rich foods encouraged. CBC, glucose, TSH, UA, Pap, GC/Chlamydia, HIV, hepatitis B, C., RPR. Pap normal 2012, new screening guidelines reviewed. Normal colonoscopy 2002, instructed to followup with gastroenterologist for colonoscopy.    Harrington Challenger WHNP, 11:04 AM 08/30/2012

## 2012-08-30 NOTE — Patient Instructions (Signed)

## 2012-08-30 NOTE — Addendum Note (Signed)
Addended by: Richardson Chiquito on: 08/30/2012 03:39 PM   Modules accepted: Orders

## 2012-08-31 LAB — URINALYSIS W MICROSCOPIC + REFLEX CULTURE
Glucose, UA: NEGATIVE mg/dL
Specific Gravity, Urine: 1.03 (ref 1.005–1.030)

## 2012-08-31 LAB — RPR

## 2012-08-31 LAB — CBC WITH DIFFERENTIAL/PLATELET
Eosinophils Relative: 3 % (ref 0–5)
HCT: 37.9 % (ref 36.0–46.0)
Lymphocytes Relative: 25 % (ref 12–46)
Lymphs Abs: 1.5 10*3/uL (ref 0.7–4.0)
MCV: 101.6 fL — ABNORMAL HIGH (ref 78.0–100.0)
Monocytes Absolute: 0.5 10*3/uL (ref 0.1–1.0)
Monocytes Relative: 8 % (ref 3–12)
RBC: 3.73 MIL/uL — ABNORMAL LOW (ref 3.87–5.11)
WBC: 6.1 10*3/uL (ref 4.0–10.5)

## 2012-08-31 LAB — HEPATITIS B SURFACE ANTIGEN: Hepatitis B Surface Ag: NEGATIVE

## 2013-03-29 ENCOUNTER — Other Ambulatory Visit: Payer: Self-pay

## 2014-03-08 ENCOUNTER — Other Ambulatory Visit: Payer: Self-pay

## 2015-05-26 ENCOUNTER — Emergency Department (HOSPITAL_BASED_OUTPATIENT_CLINIC_OR_DEPARTMENT_OTHER): Payer: Federal, State, Local not specified - PPO

## 2015-05-26 ENCOUNTER — Encounter (HOSPITAL_BASED_OUTPATIENT_CLINIC_OR_DEPARTMENT_OTHER): Payer: Self-pay | Admitting: *Deleted

## 2015-05-26 ENCOUNTER — Emergency Department (HOSPITAL_BASED_OUTPATIENT_CLINIC_OR_DEPARTMENT_OTHER)
Admission: EM | Admit: 2015-05-26 | Discharge: 2015-05-26 | Disposition: A | Discharge status: Home / Self Care | Payer: Federal, State, Local not specified - PPO | Attending: Emergency Medicine | Admitting: Emergency Medicine

## 2015-05-26 DIAGNOSIS — J45901 Unspecified asthma with (acute) exacerbation: Secondary | ICD-10-CM | POA: Diagnosis not present

## 2015-05-26 DIAGNOSIS — Z79899 Other long term (current) drug therapy: Secondary | ICD-10-CM | POA: Insufficient documentation

## 2015-05-26 DIAGNOSIS — Z862 Personal history of diseases of the blood and blood-forming organs and certain disorders involving the immune mechanism: Secondary | ICD-10-CM | POA: Insufficient documentation

## 2015-05-26 DIAGNOSIS — Z8739 Personal history of other diseases of the musculoskeletal system and connective tissue: Secondary | ICD-10-CM | POA: Diagnosis not present

## 2015-05-26 DIAGNOSIS — R011 Cardiac murmur, unspecified: Secondary | ICD-10-CM | POA: Diagnosis not present

## 2015-05-26 DIAGNOSIS — J45909 Unspecified asthma, uncomplicated: Secondary | ICD-10-CM | POA: Diagnosis present

## 2015-05-26 DIAGNOSIS — R0789 Other chest pain: Secondary | ICD-10-CM | POA: Diagnosis not present

## 2015-05-26 MED ORDER — PREDNISOLONE SODIUM PHOSPHATE 10 MG PO TBDP: 40.0000 mg | ORAL_TABLET | Freq: Every day | ORAL | Status: DC

## 2015-05-26 MED ORDER — IPRATROPIUM BROMIDE 0.02 % IN SOLN
0.5000 mg | Freq: Once | RESPIRATORY_TRACT | Status: AC
  Administered 2015-05-26: 0.5 mg via RESPIRATORY_TRACT
  Filled 2015-05-26: qty 2.5

## 2015-05-26 MED ORDER — IPRATROPIUM-ALBUTEROL 0.5-2.5 (3) MG/3ML IN SOLN: 3.0000 mL | RESPIRATORY_TRACT | Status: DC | PRN

## 2015-05-26 MED ORDER — ALBUTEROL (5 MG/ML) CONTINUOUS INHALATION SOLN
10.0000 mg/h | INHALATION_SOLUTION | Freq: Once | RESPIRATORY_TRACT | Status: AC
  Administered 2015-05-26: 10 mg/h via RESPIRATORY_TRACT
  Filled 2015-05-26: qty 20

## 2015-05-26 MED ORDER — ALBUTEROL SULFATE (2.5 MG/3ML) 0.083% IN NEBU
5.0000 mg | INHALATION_SOLUTION | Freq: Once | RESPIRATORY_TRACT | Status: AC
  Administered 2015-05-26: 5 mg via RESPIRATORY_TRACT
  Filled 2015-05-26: qty 6

## 2015-05-26 MED ORDER — PREDNISONE 20 MG PO TABS
40.0000 mg | ORAL_TABLET | Freq: Once | ORAL | Status: AC
  Administered 2015-05-26: 40 mg via ORAL
  Filled 2015-05-26: qty 2

## 2015-05-26 MED ORDER — ALBUTEROL SULFATE HFA 108 (90 BASE) MCG/ACT IN AERS: 1.0000 | INHALATION_SPRAY | Freq: Four times a day (QID) | RESPIRATORY_TRACT | Status: DC | PRN

## 2015-05-26 NOTE — ED Notes (Signed)
Pt c/o SOB with hx asthma. States inhalers aren't working. O2 sats 100%, HR 65; speaking full sentences. RT to assess

## 2015-05-26 NOTE — Discharge Instructions (Signed)
Asthma, Adult °Asthma is a recurring condition in which the airways tighten and narrow. Asthma can make it difficult to breathe. It can cause coughing, wheezing, and shortness of breath. Asthma episodes, also called asthma attacks, range from minor to life-threatening. Asthma cannot be cured, but medicines and lifestyle changes can help control it. °CAUSES °Asthma is believed to be caused by inherited (genetic) and environmental factors, but its exact cause is unknown. Asthma may be triggered by allergens, lung infections, or irritants in the air. Asthma triggers are different for each person. Common triggers include:  °· Animal dander. °· Dust mites. °· Cockroaches. °· Pollen from trees or grass. °· Mold. °· Smoke. °· Air pollutants such as dust, household cleaners, hair sprays, aerosol sprays, paint fumes, strong chemicals, or strong odors. °· Cold air, weather changes, and winds (which increase molds and pollens in the air). °· Strong emotional expressions such as crying or laughing hard. °· Stress. °· Certain medicines (such as aspirin) or types of drugs (such as beta-blockers). °· Sulfites in foods and drinks. Foods and drinks that may contain sulfites include dried fruit, potato chips, and sparkling grape juice. °· Infections or inflammatory conditions such as the flu, a cold, or an inflammation of the nasal membranes (rhinitis). °· Gastroesophageal reflux disease (GERD). °· Exercise or strenuous activity. °SYMPTOMS °Symptoms may occur immediately after asthma is triggered or many hours later. Symptoms include: °· Wheezing. °· Excessive nighttime or early morning coughing. °· Frequent or severe coughing with a common cold. °· Chest tightness. °· Shortness of breath. °DIAGNOSIS  °The diagnosis of asthma is made by a review of your medical history and a physical exam. Tests may also be performed. These may include: °· Lung function studies. These tests show how much air you breathe in and out. °· Allergy  tests. °· Imaging tests such as X-rays. °TREATMENT  °Asthma cannot be cured, but it can usually be controlled. Treatment involves identifying and avoiding your asthma triggers. It also involves medicines. There are 2 classes of medicine used for asthma treatment:  °· Controller medicines. These prevent asthma symptoms from occurring. They are usually taken every day. °· Reliever or rescue medicines. These quickly relieve asthma symptoms. They are used as needed and provide short-term relief. °Your health care provider will help you create an asthma action plan. An asthma action plan is a written plan for managing and treating your asthma attacks. It includes a list of your asthma triggers and how they may be avoided. It also includes information on when medicines should be taken and when their dosage should be changed. An action plan may also involve the use of a device called a peak flow meter. A peak flow meter measures how well the lungs are working. It helps you monitor your condition. °HOME CARE INSTRUCTIONS  °· Take medicines only as directed by your health care provider. Speak with your health care provider if you have questions about how or when to take the medicines. °· Use a peak flow meter as directed by your health care provider. Record and keep track of readings. °· Understand and use the action plan to help minimize or stop an asthma attack without needing to seek medical care. °· Control your home environment in the following ways to help prevent asthma attacks: °· Do not smoke. Avoid being exposed to secondhand smoke. °· Change your heating and air conditioning filter regularly. °· Limit your use of fireplaces and wood stoves. °· Get rid of pests (such as roaches   and mice) and their droppings. °· Throw away plants if you see mold on them. °· Clean your floors and dust regularly. Use unscented cleaning products. °· Try to have someone else vacuum for you regularly. Stay out of rooms while they are  being vacuumed and for a short while afterward. If you vacuum, use a dust mask from a hardware store, a double-layered or microfilter vacuum cleaner bag, or a vacuum cleaner with a HEPA filter. °· Replace carpet with wood, tile, or vinyl flooring. Carpet can trap dander and dust. °· Use allergy-proof pillows, mattress covers, and box spring covers. °· Wash bed sheets and blankets every week in hot water and dry them in a dryer. °· Use blankets that are made of polyester or cotton. °· Clean bathrooms and kitchens with bleach. If possible, have someone repaint the walls in these rooms with mold-resistant paint. Keep out of the rooms that are being cleaned and painted. °· Wash hands frequently. °SEEK MEDICAL CARE IF:  °· You have wheezing, shortness of breath, or a cough even if taking medicine to prevent attacks. °· The colored mucus you cough up (sputum) is thicker than usual. °· Your sputum changes from clear or white to yellow, green, gray, or bloody. °· You have any problems that may be related to the medicines you are taking (such as a rash, itching, swelling, or trouble breathing). °· You are using a reliever medicine more than 2-3 times per week. °· Your peak flow is still at 50-79% of your personal best after following your action plan for 1 hour. °· You have a fever. °SEEK IMMEDIATE MEDICAL CARE IF:  °· You seem to be getting worse and are unresponsive to treatment during an asthma attack. °· You are short of breath even at rest. °· You get short of breath when doing very little physical activity. °· You have difficulty eating, drinking, or talking due to asthma symptoms. °· You develop chest pain. °· You develop a fast heartbeat. °· You have a bluish color to your lips or fingernails. °· You are light-headed, dizzy, or faint. °· Your peak flow is less than 50% of your personal best. °  °This information is not intended to replace advice given to you by your health care provider. Make sure you discuss any  questions you have with your health care provider. °  °Document Released: 05/10/2005 Document Revised: 01/29/2015 Document Reviewed: 12/07/2012 °Elsevier Interactive Patient Education ©2016 [employer] ° °Asthma Attack Prevention °While you may not be able to control the fact that you have asthma, you can take actions to prevent asthma attacks. The best way to prevent asthma attacks is to maintain good control of your asthma. You can achieve this by: °· Taking your medicines as directed. °· Avoiding things that can irritate your airways or make your asthma symptoms worse (asthma triggers). °· Keeping track of how well your asthma is controlled and of any changes in your symptoms. °· Responding quickly to worsening asthma symptoms (asthma attack). °· Seeking emergency care when it is needed. °WHAT ARE SOME WAYS TO PREVENT AN ASTHMA ATTACK? °Have a Plan °Work with your health care provider to create a written plan for managing and treating your asthma attacks (asthma action plan). This plan includes: °· A list of your asthma triggers and how you can avoid them. °· Information on when medicines should be taken and when their dosages should be changed. °· The use of a device that measures how well your lungs are   working (peak flow meter). °Monitor Your Asthma °Use your peak flow meter and record your results in a journal every day. A drop in your peak flow numbers on one or more days may indicate the start of an asthma attack. This can happen even before you start to feel symptoms. You can prevent an asthma attack from getting worse by following the steps in your asthma action plan. °Avoid Asthma Triggers °Work with your asthma health care provider to find out what your asthma triggers are. This can be done by: °· Allergy testing. °· Keeping a journal that notes when asthma attacks occur and the factors that may have contributed to them. °· Determining if there are other medical conditions that are making your asthma  worse. °Once you have determined your asthma triggers, take steps to avoid them. This may include avoiding excessive or prolonged exposure to: °· Dust. Have someone dust and vacuum your home for you once or twice a week. Using a high-efficiency particulate arrestance (HEPA) vacuum is best. °· Smoke. This includes campfire smoke, forest fire smoke, and secondhand smoke from tobacco products. °· Pet dander. Avoid contact with animals that you know you are allergic to. °· Allergens from trees, grasses or pollens. Avoid spending a lot of time outdoors when pollen counts are high, and on very windy days. °· Very cold, dry, or humid air. °· Mold. °· Foods that contain high amounts of sulfites. °· Strong odors. °· Outdoor air pollutants, such as engine exhaust. °· Indoor air pollutants, such as aerosol sprays and fumes from household cleaners. °· Household pests, including dust mites and cockroaches, and pest droppings. °· Certain medicines, including NSAIDs. Always talk to your health care provider before stopping or starting any new medicines. °Medicines °Take over-the-counter and prescription medicines only as told by your health care provider. Many asthma attacks can be prevented by carefully following your medicine schedule. Taking your medicines correctly is especially important when you cannot avoid certain asthma triggers. °Act Quickly °If an asthma attack does happen, acting quickly can decrease how severe it is and how long it lasts. Take these steps:  °· Pay attention to your symptoms. If you are coughing, wheezing, or having difficulty breathing, do not wait to see if your symptoms go away on their own. Follow your asthma action plan. °· If you have followed your asthma action plan and your symptoms are not improving, call your health care provider or seek immediate medical care at the nearest hospital. °It is important to note how often you need to use your fast-acting rescue inhaler. If you are using your  rescue inhaler more often, it may mean that your asthma is not under control. Adjusting your asthma treatment plan may help you to prevent future asthma attacks and help you to gain better control of your condition. °HOW CAN I PREVENT AN ASTHMA ATTACK WHEN I EXERCISE? °Follow advice from your health care provider about whether you should use your fast-acting inhaler before exercising. Many people with asthma experience exercise-induced bronchoconstriction (EIB). This condition often worsens during vigorous exercise in cold, humid, or dry environments. Usually, people with EIB can stay very active by pre-treating with a fast-acting inhaler before exercising. °  °This information is not intended to replace advice given to you by your health care provider. Make sure you discuss any questions you have with your health care provider. °  °Document Released: 04/28/2009 Document Revised: 01/29/2015 Document Reviewed: 10/10/2014 °Elsevier Interactive Patient Education ©2016 [employer] ° °

## 2015-05-26 NOTE — ED Provider Notes (Signed)
CSN: [id number]     Arrival date & time 05/26/15  1855 History   First MD Initiated Contact with Patient 05/26/15 2034     Chief Complaint  Patient presents with  . Asthma     (Consider location/radiation/quality/duration/timing/severity/associated sxs/prior Treatment) Patient is a 45 y.o. female presenting with asthma. The history is provided by the patient.  Asthma This is a chronic problem. The current episode started in the past 7 days. The problem occurs constantly. The problem has been gradually worsening. Associated symptoms include coughing (dry, non-productive). Pertinent negatives include no abdominal pain, chest pain, congestion, diaphoresis, fever, nausea, sore throat, swollen glands, urinary symptoms or vomiting. The symptoms are aggravated by walking. Treatments tried: inhaler and home nebulizer. The treatment provided mild relief.   Barbara Espinoza is a 45 y.o. female with PMH significant for asthma who presents with symptoms consistent with her asthma exacerbation including SOB, chest tightness, and dry non-productive cough.  No fever, unilateral leg swelling, or sxs concerning for DVT/PE.  She does not smoke.  She reports she has been using her home nebulizer and albuterol inhaler with mild relief.  Past Medical History  Diagnosis Date  . Asthma   . Bursitis     RIGHT KNEE  . 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 Heart murmur    History reviewed. No pertinent past surgical history. Family History  Problem Relation Age of Onset  . Cancer Mother     COLON CANCER--MOM W MULTIPLE MYELOMA/HTN 2010-LIVES IN [place]  . Hypertension Mother   . Multiple myeloma Mother   . Cancer Father     stomach cancer   Social History  Substance Use Topics  . Smoking status: Never Smoker   . Smokeless tobacco: Never Used  . Alcohol Use: Yes   OB History    Gravida Para Term Preterm AB TAB SAB Ectopic Multiple Living   0 0             Review of Systems   Constitutional: Negative for fever and diaphoresis.  HENT: Negative for congestion, ear pain, rhinorrhea and sore throat.   Respiratory: Positive for cough (dry, non-productive), chest tightness, shortness of breath and wheezing. Negative for apnea.   Cardiovascular: Negative for chest pain and leg swelling.  Gastrointestinal: Negative for nausea, vomiting and abdominal pain.  All other systems reviewed and are negative.     Allergies  Peanut-containing drug products and Shellfish allergy  Home Medications   Prior to Admission medications   Medication Sig Start Date End Date Taking? Authorizing Provider  albuterol (PROVENTIL HFA;VENTOLIN HFA) 108 (90 BASE) MCG/ACT inhaler Inhale 2 puffs into the lungs every 4 (four) hours as needed. Shortness of breath and wheezing     Historical Provider, MD  albuterol (PROVENTIL HFA;VENTOLIN HFA) 108 (90 Base) MCG/ACT inhaler Inhale 1-2 puffs into the lungs every 6 (six) hours as needed for wheezing or shortness of breath. 05/26/15   Cheri Fowler, PA-C  albuterol (PROVENTIL) (2.5 MG/3ML) 0.083% nebulizer solution Take 2.5 mg by nebulization every 6 (six) hours as needed. Shortness of breath and wheezing     Historical Provider, MD  prednisoLONE (ORAPRED ODT) 10 MG disintegrating tablet Take 4 tablets (40 mg total) by mouth daily. 05/26/15   Avery Eustice, PA-C   BP 118/70 mmHg  Pulse 65  Temp(Src) 98.1 F (36.7 C) (Oral)  Resp 18  Ht 5\' 4"  (1.626 m)  Wt 83.915 kg  BMI 31.74 kg/m2  SpO2 100%  LMP 05/05/2015 Physical Exam  Constitutional: She is oriented to person, place, and time. She appears well-developed and well-nourished.  Patient appears well.  No signs of respiratory distress or labored breathing. Oxygen saturation 100% on RA.  HENT:  Head: Normocephalic and atraumatic.  Mouth/Throat: Oropharynx is clear and moist.  Eyes: Conjunctivae are normal. Pupils are equal, round, and reactive to light.  Neck: Normal range of motion. Neck supple.   Cardiovascular: Normal rate, regular rhythm and normal heart sounds.   No murmur heard. Pulmonary/Chest: Effort normal. No accessory muscle usage or stridor. No tachypnea. No respiratory distress. She has wheezes (in all lung fields). She has no rhonchi. She has no rales.  Abdominal: Soft. Bowel sounds are normal. She exhibits no distension. There is no tenderness.  Musculoskeletal: Normal range of motion.  Lymphadenopathy:    She has no cervical adenopathy.  Neurological: She is alert and oriented to person, place, and time.  Speech clear without dysarthria.  Skin: Skin is warm and dry.  Psychiatric: She has a normal mood and affect. Her behavior is normal.    ED Course  Procedures (including critical care time) Labs Review Labs Reviewed - No data to display  Imaging Review Dg Chest 2 View  05/26/2015  CLINICAL DATA:  Acute onset of worsening shortness of breath. Initial encounter. EXAM: CHEST  2 VIEW COMPARISON:  Chest radiograph performed 02/07/2011 FINDINGS: The lungs are well-aerated. Mild vascular congestion is noted, with minimal bilateral atelectasis. There is no evidence of pleural effusion or pneumothorax. The heart is borderline normal in size. No acute osseous abnormalities are seen. IMPRESSION: Mild vascular congestion, with minimal bilateral atelectasis. Electronically Signed   By: Garald Balding M.D.   On: 05/26/2015 21:45   I have personally reviewed and evaluated these images and lab results as part of my medical decision-making.   EKG Interpretation None      MDM   Final diagnoses:  Asthma exacerbation    Patient presents with asthma exacerbation x 2 days.  Tried home nebulizer and inhaler with minimal relief.  No CP, hemoptysis, unilateral leg swelling, hx of DVT/PE, recent surgery or immobilization.  Doubt PE.  Blood pressure 118/70, pulse 65, temperature 98.1 F (36.7 C), temperature source Oral, resp. rate 18, height '5\' 4"'$  (1.626 m), weight 83.915 kg, last  menstrual period 05/05/2015, SpO2 100 %. On exam, heart RRR, lungs with wheezing in all lung fields.  No accessory muscle usage.  No signs of respiratory distress.  Abdomen soft and benign.  Will obtain CXR.  Will give PO prednisone and CAT and reassess. CXR with mild vascular congestion.  Patient's symptoms much improved after CAT.  Patient continues to have ox sat of 100% on RA.  Vitals reassuring.  Patient stable for discharge.  Discussed return precautions.  Patient agrees and acknowledges the above plan for discharge.  Case has been discussed with Dr. Jeanell Sparrow who agrees with the above plan for discharge.      Gloriann Loan, PA-C 05/26/15 2254  Pattricia Boss, MD 05/26/15 [phone]

## 2015-05-26 NOTE — ED Notes (Signed)
States she has been using her inhaler more than normal x 2 days. Denies cold symptoms. Able to speak in complete sentences.

## 2015-05-26 NOTE — Progress Notes (Signed)
Patient states that she feels a very slight improvement to hear breathing after her treatment.

## 2015-05-26 NOTE — Progress Notes (Signed)
Patient ambulated around the department at a quick pace.  Patient's SPO2 remained between 98% and 99%.

## 2015-11-07 DIAGNOSIS — J45901 Unspecified asthma with (acute) exacerbation: Secondary | ICD-10-CM | POA: Diagnosis not present

## 2016-02-23 DIAGNOSIS — H1011 Acute atopic conjunctivitis, right eye: Secondary | ICD-10-CM | POA: Diagnosis not present

## 2016-03-18 DIAGNOSIS — Z1231 Encounter for screening mammogram for malignant neoplasm of breast: Secondary | ICD-10-CM | POA: Diagnosis not present

## 2016-03-31 DIAGNOSIS — F332 Major depressive disorder, recurrent severe without psychotic features: Secondary | ICD-10-CM | POA: Diagnosis not present

## 2016-04-07 DIAGNOSIS — R062 Wheezing: Secondary | ICD-10-CM | POA: Diagnosis not present

## 2016-04-07 DIAGNOSIS — J4520 Mild intermittent asthma, uncomplicated: Secondary | ICD-10-CM | POA: Diagnosis not present

## 2016-08-05 DIAGNOSIS — Z79899 Other long term (current) drug therapy: Secondary | ICD-10-CM | POA: Diagnosis not present

## 2016-08-05 DIAGNOSIS — Z8249 Family history of ischemic heart disease and other diseases of the circulatory system: Secondary | ICD-10-CM | POA: Diagnosis not present

## 2016-08-05 DIAGNOSIS — Z800 Family history of malignant neoplasm of digestive organs: Secondary | ICD-10-CM | POA: Diagnosis not present

## 2016-08-05 DIAGNOSIS — J309 Allergic rhinitis, unspecified: Secondary | ICD-10-CM | POA: Diagnosis not present

## 2016-08-05 DIAGNOSIS — K210 Gastro-esophageal reflux disease with esophagitis: Secondary | ICD-10-CM | POA: Diagnosis not present

## 2016-08-05 DIAGNOSIS — J4541 Moderate persistent asthma with (acute) exacerbation: Secondary | ICD-10-CM | POA: Diagnosis not present

## 2016-08-16 DIAGNOSIS — Z800 Family history of malignant neoplasm of digestive organs: Secondary | ICD-10-CM | POA: Diagnosis not present

## 2016-09-23 DIAGNOSIS — D123 Benign neoplasm of transverse colon: Secondary | ICD-10-CM | POA: Diagnosis not present

## 2016-09-23 DIAGNOSIS — Z1211 Encounter for screening for malignant neoplasm of colon: Secondary | ICD-10-CM | POA: Diagnosis not present

## 2016-09-23 DIAGNOSIS — Z800 Family history of malignant neoplasm of digestive organs: Secondary | ICD-10-CM | POA: Diagnosis not present

## 2016-09-23 DIAGNOSIS — D125 Benign neoplasm of sigmoid colon: Secondary | ICD-10-CM | POA: Diagnosis not present

## 2016-09-23 DIAGNOSIS — K635 Polyp of colon: Secondary | ICD-10-CM | POA: Diagnosis not present

## 2016-09-23 DIAGNOSIS — D120 Benign neoplasm of cecum: Secondary | ICD-10-CM | POA: Diagnosis not present

## 2016-10-25 DIAGNOSIS — M25572 Pain in left ankle and joints of left foot: Secondary | ICD-10-CM | POA: Diagnosis not present

## 2016-10-27 DIAGNOSIS — Z79899 Other long term (current) drug therapy: Secondary | ICD-10-CM | POA: Diagnosis not present

## 2016-10-27 DIAGNOSIS — M10079 Idiopathic gout, unspecified ankle and foot: Secondary | ICD-10-CM | POA: Diagnosis not present

## 2016-11-02 DIAGNOSIS — M25572 Pain in left ankle and joints of left foot: Secondary | ICD-10-CM | POA: Diagnosis not present

## 2016-12-23 DIAGNOSIS — H0015 Chalazion left lower eyelid: Secondary | ICD-10-CM | POA: Diagnosis not present

## 2017-01-20 DIAGNOSIS — F4323 Adjustment disorder with mixed anxiety and depressed mood: Secondary | ICD-10-CM | POA: Diagnosis not present

## 2017-03-17 DIAGNOSIS — Z1231 Encounter for screening mammogram for malignant neoplasm of breast: Secondary | ICD-10-CM | POA: Diagnosis not present

## 2017-03-20 DIAGNOSIS — J45909 Unspecified asthma, uncomplicated: Secondary | ICD-10-CM | POA: Diagnosis not present

## 2017-04-26 DIAGNOSIS — M5386 Other specified dorsopathies, lumbar region: Secondary | ICD-10-CM | POA: Diagnosis not present

## 2017-04-26 DIAGNOSIS — M5431 Sciatica, right side: Secondary | ICD-10-CM | POA: Diagnosis not present

## 2017-04-26 DIAGNOSIS — M9903 Segmental and somatic dysfunction of lumbar region: Secondary | ICD-10-CM | POA: Diagnosis not present

## 2017-04-26 DIAGNOSIS — M9902 Segmental and somatic dysfunction of thoracic region: Secondary | ICD-10-CM | POA: Diagnosis not present

## 2017-06-19 DIAGNOSIS — J45909 Unspecified asthma, uncomplicated: Secondary | ICD-10-CM | POA: Diagnosis not present

## 2017-09-05 DIAGNOSIS — Z800 Family history of malignant neoplasm of digestive organs: Secondary | ICD-10-CM | POA: Diagnosis not present

## 2017-09-09 DIAGNOSIS — L309 Dermatitis, unspecified: Secondary | ICD-10-CM | POA: Diagnosis not present

## 2017-09-21 HISTORY — PX: OTHER SURGICAL HISTORY: SHX169

## 2017-10-06 DIAGNOSIS — L2081 Atopic neurodermatitis: Secondary | ICD-10-CM | POA: Diagnosis not present

## 2017-10-07 DIAGNOSIS — L209 Atopic dermatitis, unspecified: Secondary | ICD-10-CM | POA: Diagnosis not present

## 2017-10-07 DIAGNOSIS — Z79899 Other long term (current) drug therapy: Secondary | ICD-10-CM | POA: Diagnosis not present

## 2017-10-11 DIAGNOSIS — J4520 Mild intermittent asthma, uncomplicated: Secondary | ICD-10-CM | POA: Diagnosis not present

## 2017-10-13 DIAGNOSIS — D120 Benign neoplasm of cecum: Secondary | ICD-10-CM | POA: Diagnosis not present

## 2017-10-13 DIAGNOSIS — Z1211 Encounter for screening for malignant neoplasm of colon: Secondary | ICD-10-CM | POA: Diagnosis not present

## 2017-10-13 DIAGNOSIS — K635 Polyp of colon: Secondary | ICD-10-CM | POA: Diagnosis not present

## 2017-10-13 DIAGNOSIS — D125 Benign neoplasm of sigmoid colon: Secondary | ICD-10-CM | POA: Diagnosis not present

## 2017-10-13 DIAGNOSIS — K621 Rectal polyp: Secondary | ICD-10-CM | POA: Diagnosis not present

## 2017-10-13 DIAGNOSIS — D123 Benign neoplasm of transverse colon: Secondary | ICD-10-CM | POA: Diagnosis not present

## 2017-11-10 DIAGNOSIS — L03115 Cellulitis of right lower limb: Secondary | ICD-10-CM | POA: Diagnosis not present

## 2017-11-16 DIAGNOSIS — F4323 Adjustment disorder with mixed anxiety and depressed mood: Secondary | ICD-10-CM | POA: Diagnosis not present

## 2017-11-28 DIAGNOSIS — L209 Atopic dermatitis, unspecified: Secondary | ICD-10-CM | POA: Diagnosis not present

## 2017-12-07 DIAGNOSIS — F4323 Adjustment disorder with mixed anxiety and depressed mood: Secondary | ICD-10-CM | POA: Diagnosis not present

## 2017-12-14 DIAGNOSIS — L209 Atopic dermatitis, unspecified: Secondary | ICD-10-CM | POA: Diagnosis not present

## 2017-12-21 DIAGNOSIS — F4323 Adjustment disorder with mixed anxiety and depressed mood: Secondary | ICD-10-CM | POA: Diagnosis not present

## 2017-12-28 DIAGNOSIS — F4323 Adjustment disorder with mixed anxiety and depressed mood: Secondary | ICD-10-CM | POA: Diagnosis not present

## 2017-12-28 DIAGNOSIS — L2084 Intrinsic (allergic) eczema: Secondary | ICD-10-CM | POA: Diagnosis not present

## 2017-12-30 DIAGNOSIS — J4521 Mild intermittent asthma with (acute) exacerbation: Secondary | ICD-10-CM | POA: Diagnosis not present

## 2017-12-30 DIAGNOSIS — R0981 Nasal congestion: Secondary | ICD-10-CM | POA: Diagnosis not present

## 2018-01-11 DIAGNOSIS — L209 Atopic dermatitis, unspecified: Secondary | ICD-10-CM | POA: Diagnosis not present

## 2018-02-01 DIAGNOSIS — F4323 Adjustment disorder with mixed anxiety and depressed mood: Secondary | ICD-10-CM | POA: Diagnosis not present

## 2018-02-28 DIAGNOSIS — L720 Epidermal cyst: Secondary | ICD-10-CM | POA: Diagnosis not present

## 2018-02-28 DIAGNOSIS — L2081 Atopic neurodermatitis: Secondary | ICD-10-CM | POA: Diagnosis not present

## 2018-03-15 ENCOUNTER — Ambulatory Visit (INDEPENDENT_AMBULATORY_CARE_PROVIDER_SITE_OTHER): Payer: Federal, State, Local not specified - PPO | Admitting: Women's Health

## 2018-03-15 ENCOUNTER — Encounter: Payer: Self-pay | Admitting: Women's Health

## 2018-03-15 VITALS — BP 126/82 | Ht 65.0 in | Wt 194.0 lb

## 2018-03-15 DIAGNOSIS — R5383 Other fatigue: Secondary | ICD-10-CM | POA: Diagnosis not present

## 2018-03-15 DIAGNOSIS — Z113 Encounter for screening for infections with a predominantly sexual mode of transmission: Secondary | ICD-10-CM | POA: Diagnosis not present

## 2018-03-15 DIAGNOSIS — Z01419 Encounter for gynecological examination (general) (routine) without abnormal findings: Secondary | ICD-10-CM

## 2018-03-15 LAB — WET PREP FOR TRICH, YEAST, CLUE

## 2018-03-15 MED ORDER — METRONIDAZOLE 500 MG PO TABS: 500.0000 mg | ORAL_TABLET | Freq: Two times a day (BID) | ORAL | 0 refills | Status: DC

## 2018-03-15 NOTE — Patient Instructions (Signed)

## 2018-03-15 NOTE — Progress Notes (Signed)
lab

## 2018-03-15 NOTE — Progress Notes (Addendum)
Barbara Espinoza Feb 13, 1971 [id number]    History:    Presents for annual exam.  Last office visit 2014.  Has gained approximately 49 pounds since then.  Regular monthly cycle using withdrawal or occasional condoms.  One partner x1 year and would like to conceive.  Partner 47 years younger with a 49-year-old daughter.  Normal Pap and mammogram history.  Has had annual mammograms.  Mother with colon cancer 62 survivors, 2019 benign colon polyp, 3-year follow-up.  Biggest health problem is eczema is on Dupixent and now having some leg edema, has stopped per dermatologist and has follow-up.  Past medical history, past surgical history, family history and social history were all reviewed and documented in the EPIC chart.  Works for the [employer].  Both parents deceased late 62s.  ROS:  A ROS was performed and pertinent positives and negatives are included.  Exam:  Vitals:   03/15/18 1500  BP: 126/82  Weight: 194 lb (88 kg)  Height: 5\' 5"  (1.651 m)   Body mass index is 32.28 kg/m.   General appearance:  Normal Thyroid:  Symmetrical, normal in size, without palpable masses or nodularity. Respiratory  Auscultation:  Clear without wheezing or rhonchi Cardiovascular  Auscultation:  Regular rate, without rubs, murmurs or gallops  Edema/varicosities:  Not grossly evident Abdominal  Soft,nontender, without masses, guarding or rebound.  Liver/spleen:  No organomegaly noted  Hernia:  None appreciated  Skin  Inspection:  Grossly normal   Breasts: Examined lying and sitting.     Right: Without masses, retractions, discharge or axillary adenopathy.     Left: Without masses, retractions, discharge or axillary adenopathy. Gentitourinary   Inguinal/mons:  Normal without inguinal adenopathy  External genitalia:  Normal  BUS/Urethra/Skene's glands:  Normal  Vagina:  Normal  Cervix:  Normal  Uterus: normal in size, shape and contour.  Midline and mobile  Adnexa/parametria:     Rt: Without  masses or tenderness.   Lt: Without masses or tenderness.  Anus and perineum: Normal  Digital rectal exam: Normal sphincter tone without palpated masses or tenderness  Assessment/Plan:  47 y.o. SBF G0 for annual exam.     Monthly cycle desires conception STD screen Bacterial vaginosis  obesity Smoker Eczema- dermatologist manages Asthma  Plan: Reviewed decreased fertility in the late 31s, will schedule fertility consult.  Reiewed importance of increasing frequency of intercourse, pregnancy behaviors reviewed.  Vaginal 500 twice daily for 7 days, alcohol precautions reviewed.  SBE's, continue annual screening mammogram, calcium rich foods, prenatal vitamin daily encouraged.  Encouraged to increase exercise and decrease calorie/carbs.  Keep scheduled follow-up with dermatologist.  Aware of hazards of smoking reviewed importance of no smoking especially with pregnancy.  CBC, CMP, TSH, Pap with HR HPV typing, new screening guidelines reviewed.  GC/chlamydia, HIV, hep B, C, RPR.   Huel Cote [facility], 5:07 PM 03/15/2018

## 2018-03-16 ENCOUNTER — Emergency Department (HOSPITAL_BASED_OUTPATIENT_CLINIC_OR_DEPARTMENT_OTHER)
Admission: EM | Admit: 2018-03-16 | Discharge: 2018-03-16 | Disposition: A | Discharge status: Home / Self Care | Payer: Federal, State, Local not specified - PPO | Attending: Emergency Medicine | Admitting: Emergency Medicine

## 2018-03-16 ENCOUNTER — Encounter (HOSPITAL_BASED_OUTPATIENT_CLINIC_OR_DEPARTMENT_OTHER): Payer: Self-pay

## 2018-03-16 ENCOUNTER — Telehealth: Payer: Self-pay | Admitting: *Deleted

## 2018-03-16 ENCOUNTER — Emergency Department (HOSPITAL_BASED_OUTPATIENT_CLINIC_OR_DEPARTMENT_OTHER): Payer: Federal, State, Local not specified - PPO

## 2018-03-16 ENCOUNTER — Other Ambulatory Visit: Payer: Self-pay

## 2018-03-16 DIAGNOSIS — R600 Localized edema: Secondary | ICD-10-CM | POA: Diagnosis not present

## 2018-03-16 DIAGNOSIS — F17200 Nicotine dependence, unspecified, uncomplicated: Secondary | ICD-10-CM | POA: Diagnosis not present

## 2018-03-16 DIAGNOSIS — Z91010 Allergy to peanuts: Secondary | ICD-10-CM | POA: Diagnosis not present

## 2018-03-16 DIAGNOSIS — J45909 Unspecified asthma, uncomplicated: Secondary | ICD-10-CM | POA: Diagnosis not present

## 2018-03-16 DIAGNOSIS — M7989 Other specified soft tissue disorders: Secondary | ICD-10-CM | POA: Diagnosis not present

## 2018-03-16 DIAGNOSIS — R2243 Localized swelling, mass and lump, lower limb, bilateral: Secondary | ICD-10-CM | POA: Diagnosis not present

## 2018-03-16 DIAGNOSIS — Z79899 Other long term (current) drug therapy: Secondary | ICD-10-CM | POA: Diagnosis not present

## 2018-03-16 LAB — COMPREHENSIVE METABOLIC PANEL
AG Ratio: 1.4 (calc) (ref 1.0–2.5)
ALT: 17 U/L (ref 6–29)
ALT: 20 U/L (ref 0–44)
AST: 18 U/L (ref 10–35)
AST: 19 U/L (ref 15–41)
Albumin: 4.2 g/dL (ref 3.6–5.1)
Albumin: 3.9 g/dL (ref 3.5–5.0)
Alkaline Phosphatase: 82 U/L (ref 38–126)
Alkaline phosphatase (APISO): 85 U/L (ref 33–115)
Anion gap: 8 (ref 5–15)
BILIRUBIN TOTAL: 0.6 mg/dL (ref 0.2–1.2)
BUN: 10 mg/dL (ref 7–25)
BUN: 11 mg/dL (ref 6–20)
CALCIUM: 9.1 mg/dL (ref 8.6–10.2)
CHLORIDE: 103 mmol/L (ref 98–110)
CHLORIDE: 101 mmol/L (ref 98–111)
CO2: 23 mmol/L (ref 20–32)
CO2: 28 mmol/L (ref 22–32)
CREATININE: 0.74 mg/dL (ref 0.50–1.10)
Calcium: 9.2 mg/dL (ref 8.9–10.3)
Creatinine, Ser: 0.73 mg/dL (ref 0.44–1.00)
GFR calc non Af Amer: >60 mL/min (ref >60)
Globulin: 3.1 g/dL (calc) (ref 1.9–3.7)
Glucose, Bld: 77 mg/dL (ref 65–99)
Glucose, Bld: 92 mg/dL (ref 70–99)
POTASSIUM: 3.7 mmol/L (ref 3.5–5.3)
POTASSIUM: 3.4 mmol/L — AB (ref 3.5–5.1)
SODIUM: 140 mmol/L (ref 135–146)
SODIUM: 137 mmol/L (ref 135–145)
TOTAL PROTEIN: 7.3 g/dL (ref 6.1–8.1)
Total Bilirubin: 0.8 mg/dL (ref 0.3–1.2)
Total Protein: 7.5 g/dL (ref 6.5–8.1)

## 2018-03-16 LAB — C. TRACHOMATIS/N. GONORRHOEAE RNA
C. trachomatis RNA, TMA: NOT DETECTED
N. gonorrhoeae RNA, TMA: NOT DETECTED

## 2018-03-16 LAB — CBC WITH DIFFERENTIAL/PLATELET
Abs Immature Granulocytes: 0.02 10*3/uL (ref 0.00–0.07)
BASOS PCT: 0.4 %
Basophils Absolute: 31 cells/uL (ref 0–200)
Basophils Absolute: 0 10*3/uL (ref 0.0–0.1)
Basophils Relative: 0 %
EOS PCT: 5.3 %
Eosinophils Absolute: 413 cells/uL (ref 15–500)
Eosinophils Absolute: 0.4 10*3/uL (ref 0.0–0.5)
Eosinophils Relative: 5 %
HCT: 39 % (ref 35.0–45.0)
HCT: 39.2 % (ref 36.0–46.0)
HEMOGLOBIN: 13.4 g/dL (ref 11.7–15.5)
HEMOGLOBIN: 12.8 g/dL (ref 12.0–15.0)
Immature Granulocytes: 0 %
LYMPHS ABS: 1.5 10*3/uL (ref 0.7–4.0)
LYMPHS PCT: 19 %
Lymphs Abs: 1365 cells/uL (ref 850–3900)
MCH: 34.2 pg — ABNORMAL HIGH (ref 27.0–33.0)
MCH: 34 pg (ref 26.0–34.0)
MCHC: 34.4 g/dL (ref 32.0–36.0)
MCHC: 32.7 g/dL (ref 30.0–36.0)
MCV: 99.5 fL (ref 80.0–100.0)
MCV: 104.3 fL — ABNORMAL HIGH (ref 80.0–100.0)
MONO ABS: 0.7 10*3/uL (ref 0.1–1.0)
MONOS PCT: 7.3 %
MPV: 10.7 fL (ref 7.5–12.5)
Monocytes Relative: 8 %
NEUTROS ABS: 5421 {cells}/uL (ref 1500–7800)
NEUTROS ABS: 5.4 10*3/uL (ref 1.7–7.7)
NRBC: 0 % (ref 0.0–0.2)
Neutrophils Relative %: 69.5 %
Neutrophils Relative %: 68 %
PLATELETS: 238 10*3/uL (ref 140–400)
Platelets: 259 10*3/uL (ref 150–400)
RBC: 3.92 10*6/uL (ref 3.80–5.10)
RBC: 3.76 MIL/uL — AB (ref 3.87–5.11)
RDW: 12.6 % (ref 11.0–15.0)
RDW: 13.5 % (ref 11.5–15.5)
TOTAL LYMPHOCYTE: 17.5 %
WBC mixed population: 569 cells/uL (ref 200–950)
WBC: 7.8 10*3/uL (ref 3.8–10.8)
WBC: 8 10*3/uL (ref 4.0–10.5)

## 2018-03-16 LAB — HIV ANTIBODY (ROUTINE TESTING W REFLEX): HIV 1&2 Ab, 4th Generation: NONREACTIVE

## 2018-03-16 LAB — TSH: TSH: 0.7 m[IU]/L

## 2018-03-16 LAB — HEPATITIS B SURFACE ANTIGEN: HEP B S AG: NONREACTIVE

## 2018-03-16 LAB — RPR: RPR Ser Ql: NONREACTIVE

## 2018-03-16 LAB — HEPATITIS C ANTIBODY
Hepatitis C Ab: NONREACTIVE
SIGNAL TO CUT-OFF: 0.08 (ref <1.00)

## 2018-03-16 MED ORDER — POTASSIUM CHLORIDE ER 10 MEQ PO TBCR: 10.0000 meq | EXTENDED_RELEASE_TABLET | Freq: Every day | ORAL | 0 refills | Status: DC

## 2018-03-16 MED ORDER — FUROSEMIDE 20 MG PO TABS: 20.0000 mg | ORAL_TABLET | Freq: Every day | ORAL | 0 refills | Status: DC

## 2018-03-16 NOTE — ED Provider Notes (Signed)
[facility] EMERGENCY DEPARTMENT Provider Note   CSN: [id number] Arrival date & time: 03/16/18  1900     History   Chief Complaint Chief Complaint  Patient presents with  . Leg Swelling    HPI Barbara Espinoza is a 47 y.o. female.  HPI Patient presents with swelling of both her legs.  Has had for the last around 5 days.  History is somewhat severe eczema.  She is on Dupixent.  States she had a recent flight however to [place].  She is worried clots in her legs.  No fevers.  States the skin is about at its baseline. Past Medical History:  Diagnosis Date  . Asthma   . Bursitis    RIGHT KNEE  . Heart murmur   . 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 Asthma   . Bursitis   . 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 No pertinent surgical history.   OB History    Gravida  0   Para  0   Term  0   Preterm  0   AB  0   Living  0     SAB  0   TAB  0   Ectopic  0   Multiple  0   Live Births  0            Home Medications    Prior to Admission medications   Medication Sig Start Date End Date Taking? Authorizing Provider  albuterol (PROVENTIL HFA;VENTOLIN HFA) 108 (90 BASE) MCG/ACT inhaler Inhale 2 puffs into the lungs every 4 (four) hours as needed. Shortness of breath and wheezing     [provider]  albuterol (PROVENTIL) (2.5 MG/3ML) 0.083% nebulizer solution Take 2.5 mg by nebulization every 6 (six) hours as needed. Shortness of breath and wheezing     [provider]  DUPIXENT 300 MG/2ML SOSY as directed. 03/03/18   [provider]  furosemide (LASIX) 20 MG tablet Take 1 tablet (20 mg total) by mouth daily. 03/16/18   Davonna Belling, MD  ipratropium-albuterol (DUONEB) 0.5-2.5 (3) MG/3ML SOLN Take 3 mLs by nebulization every 4 (four) hours as needed. 05/26/15   Gloriann Loan, PA-C  metroNIDAZOLE (FLAGYL) 500 MG  tablet Take 1 tablet (500 mg total) by mouth 2 (two) times daily. 03/15/18   Huel Cote, NP  Multiple Vitamin (MULTIVITAMIN) capsule Take 1 capsule by mouth daily.    [provider]  potassium chloride (K-DUR) 10 MEQ tablet Take 1 tablet (10 mEq total) by mouth daily. 03/16/18   Davonna Belling, MD  prednisoLONE (ORAPRED ODT) 10 MG disintegrating tablet Take 4 tablets (40 mg total) by mouth daily. 05/26/15   Gloriann Loan, PA-C    Family History Family History  Problem Relation Age of Onset  . Cancer Mother        COLON CANCER--MOM W MULTIPLE MYELOMA/HTN 2010-LIVES IN [place]  . Hypertension Mother   . Multiple myeloma Mother   . Cancer Father        stomach cancer    Social History Social History   Tobacco Use  . Smoking status: Current Every Day Smoker  . Smokeless tobacco: Never Used  Substance Use Topics  . Alcohol use: Yes    Comment: occ  . Drug use: Yes    Types: Marijuana     Allergies   Peanut-containing drug products and Shellfish  allergy   Review of Systems Review of Systems  Constitutional: Negative for appetite change and fever.  Respiratory: Negative for shortness of breath.   Cardiovascular: Positive for leg swelling. Negative for chest pain.  Gastrointestinal: Negative for abdominal pain.  Genitourinary: Negative for flank pain.  Musculoskeletal: Negative for back pain.  Skin: Positive for rash.  Neurological: Negative for weakness and numbness.  Psychiatric/Behavioral: Negative for confusion.     Physical Exam Updated Vital Signs BP 138/68   Pulse (!) 52   Temp 98.3 F (36.8 C)   Resp 16   Ht _0  (1.626 m)   Wt 87.5 kg   LMP 03/04/2018   SpO2 98%   BMI 33.13 kg/m   Physical Exam  Constitutional: She appears well-developed.  HENT:  Head: Atraumatic.  Eyes: EOM are normal.  Cardiovascular: Normal rate.  Pulmonary/Chest: Effort normal.  Abdominal: Soft.  Musculoskeletal: She exhibits edema.  The bilateral lower  extremity pitting edema.  Neurological: She is alert.  Skin: Skin is warm. Capillary refill takes less than 2 seconds.  Eczematous changes to bilateral lower extremities and upper extremities.     ED Treatments / Results  Labs (all labs ordered are listed, but only abnormal results are displayed) Labs Reviewed  COMPREHENSIVE METABOLIC PANEL - Abnormal; Notable for the following components:      Result Value   Potassium 3.4 (*)    All other components within normal limits  CBC WITH DIFFERENTIAL/PLATELET - Abnormal; Notable for the following components:   RBC 3.76 (*)    MCV 104.3 (*)    All other components within normal limits    EKG EKG Interpretation  Date/Time:  Thursday March 16 2018 19:39:46 EDT Ventricular Rate:  54 PR Interval:    QRS Duration: 83 QT Interval:  428 QTC Calculation: 406 R Axis:   73 Text Interpretation:  Sinus rhythm Consider left atrial enlargement Confirmed by Davonna Belling [phone]) on 03/16/2018 8:57:36 PM   Radiology US Venous Img Lower Bilateral  Result Date: 03/16/2018 CLINICAL DATA:  Bilateral lower extremity swelling EXAM: BILATERAL LOWER EXTREMITY VENOUS DOPPLER ULTRASOUND TECHNIQUE: Gray-scale sonography with graded compression, as well as color Doppler and duplex ultrasound were performed to evaluate the lower extremity deep venous systems from the level of the common femoral vein and including the common femoral, femoral, profunda femoral, popliteal and calf veins including the posterior tibial, peroneal and gastrocnemius veins when visible. The superficial great saphenous vein was also interrogated. Spectral Doppler was utilized to evaluate flow at rest and with distal augmentation maneuvers in the common femoral, femoral and popliteal veins. COMPARISON:  None. FINDINGS: RIGHT LOWER EXTREMITY Common Femoral Vein: No evidence of thrombus. Normal compressibility, respiratory phasicity and response to augmentation. Saphenofemoral Junction:  No evidence of thrombus. Normal compressibility and flow on color Doppler imaging. Profunda Femoral Vein: No evidence of thrombus. Normal compressibility and flow on color Doppler imaging. Femoral Vein: No evidence of thrombus. Normal compressibility, respiratory phasicity and response to augmentation. Popliteal Vein: No evidence of thrombus. Normal compressibility, respiratory phasicity and response to augmentation. Calf Veins: No evidence of thrombus. Normal compressibility and flow on color Doppler imaging. Superficial Great Saphenous Vein: No evidence of thrombus. Normal compressibility. Venous Reflux:  None. Other Findings:  None. LEFT LOWER EXTREMITY Common Femoral Vein: No evidence of thrombus. Normal compressibility, respiratory phasicity and response to augmentation. Saphenofemoral Junction: No evidence of thrombus. Normal compressibility and flow on color Doppler imaging. Profunda Femoral Vein: No evidence of thrombus. Normal compressibility and flow  on color Doppler imaging. Femoral Vein: No evidence of thrombus. Normal compressibility, respiratory phasicity and response to augmentation. Popliteal Vein: No evidence of thrombus. Normal compressibility, respiratory phasicity and response to augmentation. Calf Veins: No evidence of thrombus. Normal compressibility and flow on color Doppler imaging. Superficial Great Saphenous Vein: No evidence of thrombus. Normal compressibility. Venous Reflux:  None. Other Findings:  None. IMPRESSION: No evidence of deep venous thrombosis. Electronically Signed   By: Rolm Baptise M.D.   On: 03/16/2018 20:24    Procedures Procedures (including critical care time)  Medications Ordered in ED Medications - No data to display   Initial Impression / Assessment and Plan / ED Course  I have reviewed the triage vital signs and the nursing notes.  Pertinent labs & imaging results that were available during my care of the patient were reviewed by me and considered in my  medical decision making (see chart for details).     Patient with pitting edema bilateral lower extremities.  Has had pitting edema in the past but usually not this severe.  Negative Dopplers.  Work-up reassuring with lab work showing good renal function and normal hepatic function.  Will give short course of Lasix and potassium.  Follow-up with PCP and her dermatologist.  Final Clinical Impressions(s) / ED Diagnoses   Final diagnoses:  Leg edema    ED Discharge Orders         Ordered    furosemide (LASIX) 20 MG tablet  Daily     03/16/18 2103    potassium chloride (K-DUR) 10 MEQ tablet  Daily     03/16/18 2103           Davonna Belling, MD 03/16/18 2115

## 2018-03-16 NOTE — ED Triage Notes (Signed)
C/o bilat LE swelling x 5 days-recent flight travel-NAD-steady gait

## 2018-03-16 NOTE — Telephone Encounter (Signed)
Referral faxed to Dr. Kerin Perna office they will call to schedule.

## 2018-03-16 NOTE — Telephone Encounter (Signed)
-----   Message from Huel Cote, NP sent at 03/15/2018  5:11 PM EDT ----- Barbara Espinoza is 47 years old desiring conception partner is 39 years younger who has had a child .  She does have regular monthly cycles please schedule her for fertility consult appointment.  Afternoons best thank you

## 2018-03-17 LAB — PAP, TP IMAGING W/ HPV RNA, RFLX HPV TYPE 16,18/45: HPV DNA HIGH RISK: NOT DETECTED

## 2018-03-21 DIAGNOSIS — Z1231 Encounter for screening mammogram for malignant neoplasm of breast: Secondary | ICD-10-CM | POA: Diagnosis not present

## 2018-03-30 NOTE — Telephone Encounter (Signed)
Patient scheduled on 04/14/18 @ 3:15pm

## 2018-04-14 DIAGNOSIS — N840 Polyp of corpus uteri: Secondary | ICD-10-CM | POA: Diagnosis not present

## 2018-04-14 DIAGNOSIS — D250 Submucous leiomyoma of uterus: Secondary | ICD-10-CM | POA: Diagnosis not present

## 2018-04-17 ENCOUNTER — Telehealth: Payer: Self-pay

## 2018-04-17 NOTE — Telephone Encounter (Signed)
Patient was referred to [facility]. Was told she has a polyp that should be removed. She called questioning if she wants to return to [facility] to have this done or let MD at [facility] do it. She has questions regarding scheduling and costs, ins, etc.

## 2018-04-17 NOTE — Telephone Encounter (Signed)
Called patient back and discussed ins benefits, estimated surgery prepymt, schedules, etc. I recommended that if she would like to come here she should schedule appointment with Dr. Loetta Rough (back up MD) and bring records.  She thanked me for the information and will consider how she wants to proceed.

## 2018-04-23 DIAGNOSIS — J4531 Mild persistent asthma with (acute) exacerbation: Secondary | ICD-10-CM | POA: Diagnosis not present

## 2018-04-23 DIAGNOSIS — Z760 Encounter for issue of repeat prescription: Secondary | ICD-10-CM | POA: Diagnosis not present

## 2018-05-10 DIAGNOSIS — J45909 Unspecified asthma, uncomplicated: Secondary | ICD-10-CM | POA: Diagnosis not present

## 2018-05-10 DIAGNOSIS — L309 Dermatitis, unspecified: Secondary | ICD-10-CM | POA: Diagnosis not present

## 2018-05-10 DIAGNOSIS — J3089 Other allergic rhinitis: Secondary | ICD-10-CM | POA: Diagnosis not present

## 2018-05-30 DIAGNOSIS — J45901 Unspecified asthma with (acute) exacerbation: Secondary | ICD-10-CM | POA: Diagnosis not present

## 2018-06-01 DIAGNOSIS — L2084 Intrinsic (allergic) eczema: Secondary | ICD-10-CM | POA: Diagnosis not present

## 2018-07-03 DIAGNOSIS — H00013 Hordeolum externum right eye, unspecified eyelid: Secondary | ICD-10-CM | POA: Diagnosis not present

## 2018-07-17 ENCOUNTER — Telehealth: Payer: Self-pay

## 2018-07-17 NOTE — Telephone Encounter (Signed)
Okay 

## 2018-07-17 NOTE — Telephone Encounter (Signed)
Spoke with patient and advised. Patient scheduled appointment and will make arrangements to get her records.

## 2018-07-17 NOTE — Telephone Encounter (Signed)
She is a patient of [facility] and [facility] recommended surgery to remove uterine polyp. Patient is requesting possibly doing it with [facility] as "I haven't been able to get in them to get this done".    She had called me about it back on 04/17/18 and I told her to bring her records and schedule appointment with you.  Are you okay with that?

## 2018-07-19 ENCOUNTER — Encounter: Payer: Self-pay | Admitting: Gynecology

## 2018-07-19 ENCOUNTER — Ambulatory Visit: Payer: Federal, State, Local not specified - PPO | Admitting: Gynecology

## 2018-07-19 VITALS — BP 124/80

## 2018-07-19 DIAGNOSIS — N926 Irregular menstruation, unspecified: Secondary | ICD-10-CM | POA: Diagnosis not present

## 2018-07-19 DIAGNOSIS — D259 Leiomyoma of uterus, unspecified: Secondary | ICD-10-CM

## 2018-07-19 DIAGNOSIS — N840 Polyp of corpus uteri: Secondary | ICD-10-CM

## 2018-07-19 NOTE — Patient Instructions (Signed)
Follow-up for the ultrasound as scheduled. 

## 2018-07-19 NOTE — Progress Notes (Signed)
    Barbara Espinoza Sep 25, 1970 [id number]        47 y.o.  [name] patient of [facility] who was seen at the [facility] to look at what her fertility options are.  They did an ultrasound which described 4 myomas without submucous components and an endometrial polyp 14 mm.  Patient has monthly menses without intermenstrual bleeding or significant cramping.  She has been late for her January menses by a week or 2 and she is late for her February menses.  Not having significant hot flashes or sweats.  Past medical history,surgical history, problem list, medications, allergies, family history and social history were all reviewed and documented in the EPIC chart.  Directed ROS with pertinent positives and negatives documented in the history of present illness/assessment and plan.  Exam: Caryn Bee assistant Vitals:   07/19/18 1510  BP: 124/80   General appearance:  Normal Abdomen soft nontender without masses guarding rebound Pelvic external BUS vagina normal.  Cervix normal.  Uterus retroverted generous in size midline mobile nontender.  Adnexa without masses or tenderness.  Assessment/Plan:  48 y.o. [name] with regular ultrasound done elsewhere showing endometrial polyp and for myomas although no measurements given.  Not having significant intermenstrual bleeding or pain.  Has been late for the her last 2 menses.  Not having significant menopausal symptoms.  Will check baseline FSH.  Options for management reviewed to include proceeding directly to hysteroscopy D&C with removal of the endometrial polyp as requested by the [facility] Institute versus doing a sonohysterogram to document the presence of the polyp before proceeding with the surgery as the ultrasound was done elsewhere.  Both scenarios were reviewed and the possibility of doing surgery and finding no polyp versus repeating the ultrasound documenting a polyp and then proceeding with surgery reviewed.  At this point the  patient would like to proceed with ultrasound first to better define the polyp in the myomas and then proceed with hysteroscopy D&C if present.  Patient will schedule a sonohysterogram and follow-up for this.    Anastasio Auerbach MD, 3:24 PM 07/19/2018

## 2018-07-19 NOTE — Addendum Note (Signed)
Addended by: Nelva Nay on: 07/19/2018 03:44 PM   Modules accepted: Orders

## 2018-08-10 ENCOUNTER — Encounter: Payer: Self-pay | Admitting: Gynecology

## 2018-08-10 ENCOUNTER — Other Ambulatory Visit: Payer: Self-pay | Admitting: Gynecology

## 2018-08-10 ENCOUNTER — Ambulatory Visit (INDEPENDENT_AMBULATORY_CARE_PROVIDER_SITE_OTHER): Payer: Federal, State, Local not specified - PPO | Admitting: Gynecology

## 2018-08-10 ENCOUNTER — Other Ambulatory Visit: Payer: Self-pay

## 2018-08-10 ENCOUNTER — Ambulatory Visit: Payer: Federal, State, Local not specified - PPO

## 2018-08-10 VITALS — BP 130/82

## 2018-08-10 DIAGNOSIS — N840 Polyp of corpus uteri: Secondary | ICD-10-CM

## 2018-08-10 DIAGNOSIS — N912 Amenorrhea, unspecified: Secondary | ICD-10-CM

## 2018-08-10 DIAGNOSIS — D250 Submucous leiomyoma of uterus: Secondary | ICD-10-CM

## 2018-08-10 DIAGNOSIS — D259 Leiomyoma of uterus, unspecified: Secondary | ICD-10-CM

## 2018-08-10 DIAGNOSIS — N926 Irregular menstruation, unspecified: Secondary | ICD-10-CM

## 2018-08-10 DIAGNOSIS — D251 Intramural leiomyoma of uterus: Secondary | ICD-10-CM

## 2018-08-10 DIAGNOSIS — N83201 Unspecified ovarian cyst, right side: Secondary | ICD-10-CM

## 2018-08-10 LAB — PREGNANCY, URINE: Preg Test, Ur: NEGATIVE

## 2018-08-10 NOTE — Progress Notes (Signed)
    Barbara Espinoza 1970/11/30 [id number]        48 y.o.  [name] presents for sonohysterogram.  Was seen at [facility] where ultrasound showed multiple myomas and endometrial polyp.  She was having regular monthly menses although her last menstrual.  Was June 12, 2018.  No significant menopausal symptoms.  UPT is negative today.  Past medical history,surgical history, problem list, medications, allergies, family history and social history were all reviewed and documented in the EPIC chart.  Directed ROS with pertinent positives and negatives documented in the history of present illness/assessment and plan.  Exam: Ivin Booty assistant Vitals:   08/10/18 1603  BP: 130/82   General appearance:  Normal Abdomen soft nontender without masses guarding rebound Pelvic external BUS vagina normal.  Cervix normal.  Uterus retroverted generous in size.  Adnexa without masses or tenderness  Ultrasound transvaginal shows uterus grossly normal in size with multiple myomas up to 5 measured.  Largest 27 x 21 mm.  Submucous component to 1 of the myomas noted.  Endometrium with 11 mm focal mass consistent with polyp with feeder vessel.  Left ovary normal.  Right ovary multicystic with largest component 28 x 24 mm with septation.  Small area of flow within septation noted.  No peripheral flow to the larger cyst noted.  Cul-de-sac negative.  Assessment/Plan:  48 y.o. [name] with history of infertility.  Recent ultrasound at fertility clinic suggested polyp.  Patient's last menstrual period was in January.  Not having significant menopausal symptoms.  Will check baseline FSH today.  Discussed the endometrial polyp and potential histology to include benign, hyperplastic and malignant.  Also discussed the submucous component of her myoma and that if we resect the endometrial polyp we will go ahead and resect the portion of the myoma that protrudes into the cavity.  She understands that we may not be  able to remove all of that myoma and we are not addressing the intra-mural myomas.  Lastly we discussed the multicystic right ovary.  Size is small and cysts are simple although there is a septation versus cyst within a cyst.  We will plan to relook at this preoperatively and if significant change as far as enlargement then may consider adding laparoscopy to the hysteroscopy to address the cyst.  We discussed the potential histology with the ovarian cyst to include functional versus tumor, benign versus malignant.  Patient agrees with the plan.  She will follow-up for the biopsy results from the sonohysterogram and to ultimately arrange surgery.  Due to Covid 19 she knows that we will be scheduling the surgery as soon as the lifting of the elective surgery ban unless the endometrial biopsy from the sonohysterogram shows atypical changes.    Barbara Auerbach MD, 4:29 PM 08/10/2018

## 2018-08-10 NOTE — Patient Instructions (Signed)
Office will call you with the biopsy results from the ultrasound.  Office will call you to schedule the surgery to remove the polyp and the portion of the fibroid tumor that is in the cavity of the uterus.  We will plan on repeating the ultrasound to look at the right ovary before we proceed with the surgery.

## 2018-08-10 NOTE — Addendum Note (Signed)
Addended by: Nelva Nay on: 08/10/2018 04:42 PM   Modules accepted: Orders

## 2018-08-12 LAB — FOLLICLE STIMULATING HORMONE: FSH: 17.5 m[IU]/mL

## 2018-08-29 DIAGNOSIS — K210 Gastro-esophageal reflux disease with esophagitis: Secondary | ICD-10-CM | POA: Diagnosis not present

## 2018-08-29 DIAGNOSIS — J309 Allergic rhinitis, unspecified: Secondary | ICD-10-CM | POA: Diagnosis not present

## 2018-08-29 DIAGNOSIS — Z800 Family history of malignant neoplasm of digestive organs: Secondary | ICD-10-CM | POA: Diagnosis not present

## 2018-08-29 DIAGNOSIS — J4541 Moderate persistent asthma with (acute) exacerbation: Secondary | ICD-10-CM | POA: Diagnosis not present

## 2018-08-30 ENCOUNTER — Encounter: Payer: Self-pay | Admitting: *Deleted

## 2018-09-04 ENCOUNTER — Encounter: Payer: Self-pay | Admitting: *Deleted

## 2018-09-04 NOTE — Progress Notes (Signed)
Mailing letter regarding scheduling surgery since no reply through [place]. KW CMA

## 2018-10-03 ENCOUNTER — Other Ambulatory Visit: Payer: Self-pay | Admitting: Gynecology

## 2018-10-03 DIAGNOSIS — N83201 Unspecified ovarian cyst, right side: Secondary | ICD-10-CM

## 2018-10-04 ENCOUNTER — Telehealth: Payer: Self-pay

## 2018-10-04 NOTE — Telephone Encounter (Addendum)
I called patient yesterday to let her know that we have resumed scheduling surgery. I scheduled her for 10/30/2018 at 8:45am. Pre op appt u/s was scheduled with Dr. Loetta Rough.  I discussed her ins benefits and her estimated surgery prepymt due.  I have mailed her a Ambulance person and a [facility] pamphlet.  Patient called back today because she wanted it to be known that she is "a difficult stick".  She said that they have had to use u/s to get her IV in before.  She said the last time she was to have surgery she could not because they could not get IV in her vein. She said she "has exema and deep, tiny veins."  I advised patient that when pre admitting nurse calls her to be sure she mentions this to her so that can note it on her OR chart.

## 2018-10-05 ENCOUNTER — Encounter: Payer: Self-pay | Admitting: Gynecology

## 2018-10-18 ENCOUNTER — Other Ambulatory Visit: Payer: Self-pay

## 2018-10-19 ENCOUNTER — Other Ambulatory Visit: Payer: Self-pay | Admitting: Gynecology

## 2018-10-19 ENCOUNTER — Ambulatory Visit: Payer: Federal, State, Local not specified - PPO | Admitting: Gynecology

## 2018-10-19 ENCOUNTER — Encounter: Payer: Self-pay | Admitting: Gynecology

## 2018-10-19 ENCOUNTER — Ambulatory Visit: Payer: Federal, State, Local not specified - PPO

## 2018-10-19 VITALS — BP 116/74

## 2018-10-19 DIAGNOSIS — N83201 Unspecified ovarian cyst, right side: Secondary | ICD-10-CM

## 2018-10-19 DIAGNOSIS — D250 Submucous leiomyoma of uterus: Secondary | ICD-10-CM

## 2018-10-19 DIAGNOSIS — D251 Intramural leiomyoma of uterus: Secondary | ICD-10-CM

## 2018-10-19 DIAGNOSIS — N7011 Chronic salpingitis: Secondary | ICD-10-CM | POA: Diagnosis not present

## 2018-10-19 DIAGNOSIS — N840 Polyp of corpus uteri: Secondary | ICD-10-CM | POA: Diagnosis not present

## 2018-10-19 MED ORDER — MISOPROSTOL 100 MCG PO TABS: ORAL_TABLET | ORAL | 0 refills | Status: DC

## 2018-10-19 NOTE — Progress Notes (Signed)
Barbara Espinoza 1971-05-14 [id number]        48 y.o.  [name] presents for her preoperative visit for her upcoming hysteroscopy D&C for removal of endometrial polyp and submucous myomas.  Was evaluated at [facility] where an ultrasound showed multiple myomas and endometrial polyp.  She had a follow-up sonohysterogram which confirmed multiple myomas largest measuring 27 mm with a sub-mucus component to 1 of the myomas.  Also an 11 mm endometrial mass consistent with a polyp.  Left ovary was normal.  Right ovary was multicystic with the largest component 28 x 24 mm with septation.  Endometrial biopsy suggested benign endometrial polyp.  She is also was arranged for ultrasound follow-up today to relook at the right ovary before proceeding with the hysteroscopy.  Past medical history,surgical history, problem list, medications, allergies, family history and social history were all reviewed and documented in the EPIC chart.  Directed ROS with pertinent positives and negatives documented in the history of present illness/assessment and plan.  Exam: Caryn Bee assistant Vitals:   10/19/18 1224  BP: 116/74   General appearance:  Normal HEENT normal Lungs clear Cardiac regular rate no rubs murmurs or gallops Abdomen soft nontender without masses guarding rebound Pelvic external BUS vagina normal.  Cervix normal.  Uterus retroverted generous in size midline mobile nontender.  Adnexa without masses or tenderness.  Ultrasound transvaginal shows uterus with multiple small myomas with submucosal myoma noted at 18 x 14 mm.  Endometrial echo 7.7 mm with focal thickening and feeder vessel suggesting polyp.  Right ovary is normal with resolution of the prior cyst.  She does appear to have a small right hydrosalpinx measuring 14 mm x 46 mm.  Left ovary with 51 x 39 mm simple cyst negative flow.  Cul-de-sac negative.  Assessment/Plan:  48 y.o. [name] with endometrial polyp and submucous  myoma for hysteroscopy D&C with resection of the endometrial defects.  [name].  She understands that we are not addressing all of her myomas but only the submucous components that project within the endometrial cavity.  I reviewed the proposed surgery with the patient to include the expected intraoperative and postoperative courses as well as the recovery period. The use of the hysteroscope, resectoscope and the D&C portion were all discussed. The risks of surgery to include infection, prolonged antibiotics, hemorrhage necessitating transfusion and the risks of transfusion, including transfusion reaction, hepatitis, HIV, mad cow disease and other unknown entities were all discussed understood and accepted. The risk of damage to internal organs during the procedure, either immediately recognized or delay recognized, including vagina, cervix, uterus, possible perforation causing damage to bowel, bladder, ureters, vessels and nerves necessitating major exploratory reparative surgery and future reparative surgeries including bladder repair, ureteral damage repair, bowel resection, ostomy formation was also discussed understood and accepted. The patient's questions were answered to her satisfaction and she is ready to proceed with surgery.  Discussed her ultrasound results which shows resolution of the right ovarian cyst.  She does have what appears to be a small hydrosalpinx also on that side and in retrospect with part of the multicystic change of her ovary on her prior ultrasound might have been incorporation of the hydro-into these measurements.  Regardless it appears as a very benign process.  She now has a simple left ovarian cyst.  The issues of whether to follow-up and re-ultrasound the left side was discussed and will be readdressed postoperatively.    Krysia Zahradnik P Tamila Gaulin  MD, 12:47 PM 10/19/2018

## 2018-10-19 NOTE — Patient Instructions (Signed)
Followup for surgery as scheduled. 

## 2018-10-19 NOTE — H&P (Signed)
Barbara Espinoza September 14, 1970 [id number]   History and Physical  Chief complaint: Endometrial polyp, leiomyoma  History of present illness: 48 y.o. [name] was evaluated at [facility] where an ultrasound showed multiple myomas and endometrial polyp.  She had a follow-up sonohysterogram which confirmed multiple myomas largest measuring 27 mm with a sub-mucus component to 1 of the myomas.  Also an 11 mm endometrial mass consistent with a polyp.  Left ovary was normal.  Right ovary was multicystic with the largest component 28 x 24 mm with septation.  Endometrial biopsy suggested benign endometrial polyp.    Follow-up ultrasound showed resolution of the right ovarian cyst although suggested a small right hydrosalpinx and a small left ovarian simple cyst at 45 mm mean with negative color flow Doppler.  Patient is admitted for hysteroscopy D&C with resection of the endometrial polyp and submucous component of the myoma.  Past Medical History:  Diagnosis Date  . Asthma   . Bursitis    RIGHT KNEE  . Heart murmur   . 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  Family History  Problem Relation Age of Onset  . Cancer Mother        COLON CANCER--MOM W MULTIPLE MYELOMA/HTN 2010-LIVES IN [place]  . Hypertension Mother   . Multiple myeloma Mother   . Cancer Father        stomach cancer    Social History:  reports that she has been smoking. She has never used smokeless tobacco. She reports current alcohol use. She reports current drug use. Drug: Marijuana.  Allergies:  Allergies  Allergen Reactions  . Peanut-Containing Drug Products Hives    "NUTS" Reaction: wheezing  . Shellfish Allergy Hives    Reaction: wheezing    Medications: See Epic for the most current list of medications.  ROS:  Was performed and pertinent positives and negatives are included in the history of present illness.  Exam:  Barbara Espinoza  assistant Vitals:   10/19/18 1224  BP: 116/74   General appearance:  Normal HEENT normal Lungs clear Cardiac regular rate no rubs murmurs or gallops Abdomen soft nontender without masses guarding rebound Pelvic external BUS vagina normal.  Cervix normal.  Uterus retroverted generous in size midline mobile nontender.  Adnexa without masses or tenderness.    Assessment/Plan:  48 y.o. [name] with endometrial polyp and submucous myoma for hysteroscopy D&C with resection of the endometrial defects.  [name].  She understands that we are not addressing all of her myomas but only the submucous components that project within the endometrial cavity.  I reviewed the proposed surgery with the patient to include the expected intraoperative and postoperative courses as well as the recovery period. The use of the hysteroscope, resectoscope and the D&C portion were all discussed. The risks of surgery to include infection, prolonged antibiotics, hemorrhage necessitating transfusion and the risks of transfusion, including transfusion reaction, hepatitis, HIV, mad cow disease and other unknown entities were all discussed understood and accepted. The risk of damage to internal organs during the procedure, either immediately recognized or delay recognized, including vagina, cervix, uterus, possible perforation causing damage to bowel, bladder, ureters, vessels and nerves necessitating major exploratory reparative surgery and future reparative surgeries including bladder repair, ureteral damage repair, bowel resection, ostomy formation was also discussed understood and accepted. The patient's questions were answered to her satisfaction and she is ready to proceed with surgery.  Barbara Auerbach MD, 12:55 PM 10/19/2018

## 2018-10-24 ENCOUNTER — Encounter (HOSPITAL_BASED_OUTPATIENT_CLINIC_OR_DEPARTMENT_OTHER): Payer: Self-pay | Admitting: *Deleted

## 2018-10-24 ENCOUNTER — Other Ambulatory Visit: Payer: Self-pay

## 2018-10-24 NOTE — Progress Notes (Signed)
Spoke with Barbara Espinoza after midnight food, clear liquids midnight until 430 am, drink presurgery ensure at 430 am then Espinoza meds to take: albuterol inhaler prn and bring, duoneb, albuterol nebulizer ekg 03-16-18 epic/chart Has appointment for labs cbc, serum preg and covid test 245 pm 10-26-18 Has surgery orders in epic

## 2018-10-26 ENCOUNTER — Other Ambulatory Visit: Payer: Self-pay

## 2018-10-26 ENCOUNTER — Encounter (HOSPITAL_COMMUNITY)
Admission: RE | Admit: 2018-10-26 | Discharge: 2018-10-26 | Disposition: A | Discharge status: Home / Self Care | Payer: Federal, State, Local not specified - PPO | Source: Ambulatory Visit | Attending: Gynecology | Admitting: Gynecology

## 2018-10-26 ENCOUNTER — Other Ambulatory Visit (HOSPITAL_COMMUNITY)
Admission: RE | Admit: 2018-10-26 | Discharge: 2018-10-26 | Disposition: A | Discharge status: Home / Self Care | Payer: Federal, State, Local not specified - PPO | Source: Ambulatory Visit | Attending: Gynecology | Admitting: Gynecology

## 2018-10-26 DIAGNOSIS — Z1159 Encounter for screening for other viral diseases: Secondary | ICD-10-CM | POA: Diagnosis not present

## 2018-10-26 DIAGNOSIS — Z01812 Encounter for preprocedural laboratory examination: Secondary | ICD-10-CM | POA: Insufficient documentation

## 2018-10-26 LAB — CBC
HCT: 40.5 % (ref 36.0–46.0)
Hemoglobin: 13.5 g/dL (ref 12.0–15.0)
MCH: 34.2 pg — ABNORMAL HIGH (ref 26.0–34.0)
MCHC: 33.3 g/dL (ref 30.0–36.0)
MCV: 102.5 fL — ABNORMAL HIGH (ref 80.0–100.0)
Platelets: 321 10*3/uL (ref 150–400)
RBC: 3.95 MIL/uL (ref 3.87–5.11)
RDW: 13.2 % (ref 11.5–15.5)
WBC: 10.3 10*3/uL (ref 4.0–10.5)
nRBC: 0 % (ref 0.0–0.2)

## 2018-10-26 LAB — HCG, SERUM, QUALITATIVE: Preg, Serum: NEGATIVE

## 2018-10-27 LAB — NOVEL CORONAVIRUS, NAA (HOSP ORDER, SEND-OUT TO REF LAB; TAT 18-24 HRS): SARS-CoV-2, NAA: NOT DETECTED

## 2018-10-27 NOTE — Progress Notes (Signed)
  Attempted to make covid pre screening call unable to leave voicemail.

## 2018-10-27 NOTE — Progress Notes (Signed)
SPOKE W/  _     SCREENING SYMPTOMS OF COVID 19:pt   COUGH--no  RUNNY NOSE--- no  SORE THROAT---no  NASAL CONGESTION----no  SNEEZING--no--  SHORTNESS OF BREATH-no--  DIFFICULTY BREATHING-no--  TEMP >100.0 ----no-  UNEXPLAINED BODY ACHES--no----  CHILLS ----no----   HEADACHES ----no-----  LOSS OF SMELL/ TASTE ------no--    HAVE YOU OR ANY FAMILY MEMBER TRAVELLED PAST 14 DAYS OUT OF THE   COUNTY--no- STATE--no-- COUNTRY---no-  HAVE YOU OR ANY FAMILY MEMBER BEEN EXPOSED TO ANYONE WITH COVID 19?  no

## 2018-10-30 ENCOUNTER — Ambulatory Visit (HOSPITAL_BASED_OUTPATIENT_CLINIC_OR_DEPARTMENT_OTHER)
Admission: RE | Admit: 2018-10-30 | Discharge: 2018-10-30 | Disposition: A | Discharge status: Home / Self Care | Payer: Federal, State, Local not specified - PPO | Attending: Gynecology | Admitting: Gynecology

## 2018-10-30 ENCOUNTER — Ambulatory Visit (HOSPITAL_BASED_OUTPATIENT_CLINIC_OR_DEPARTMENT_OTHER): Payer: Federal, State, Local not specified - PPO | Admitting: Anesthesiology

## 2018-10-30 ENCOUNTER — Ambulatory Visit (HOSPITAL_BASED_OUTPATIENT_CLINIC_OR_DEPARTMENT_OTHER): Payer: Federal, State, Local not specified - PPO | Admitting: Physician Assistant

## 2018-10-30 ENCOUNTER — Encounter (HOSPITAL_BASED_OUTPATIENT_CLINIC_OR_DEPARTMENT_OTHER): Payer: Self-pay

## 2018-10-30 ENCOUNTER — Encounter (HOSPITAL_BASED_OUTPATIENT_CLINIC_OR_DEPARTMENT_OTHER)
Admission: RE | Disposition: A | Discharge status: Home / Self Care | Payer: Self-pay | Source: Home / Self Care | Attending: Gynecology

## 2018-10-30 DIAGNOSIS — D250 Submucous leiomyoma of uterus: Secondary | ICD-10-CM | POA: Diagnosis not present

## 2018-10-30 DIAGNOSIS — Z91018 Allergy to other foods: Secondary | ICD-10-CM | POA: Insufficient documentation

## 2018-10-30 DIAGNOSIS — N840 Polyp of corpus uteri: Secondary | ICD-10-CM | POA: Insufficient documentation

## 2018-10-30 DIAGNOSIS — Z91013 Allergy to seafood: Secondary | ICD-10-CM | POA: Diagnosis not present

## 2018-10-30 DIAGNOSIS — Z8249 Family history of ischemic heart disease and other diseases of the circulatory system: Secondary | ICD-10-CM | POA: Insufficient documentation

## 2018-10-30 DIAGNOSIS — D841 Defects in the complement system: Secondary | ICD-10-CM | POA: Diagnosis not present

## 2018-10-30 DIAGNOSIS — F17200 Nicotine dependence, unspecified, uncomplicated: Secondary | ICD-10-CM | POA: Diagnosis not present

## 2018-10-30 DIAGNOSIS — J45909 Unspecified asthma, uncomplicated: Secondary | ICD-10-CM | POA: Diagnosis not present

## 2018-10-30 HISTORY — DX: Other specified health status: Z78.9

## 2018-10-30 HISTORY — DX: Other seasonal allergic rhinitis: J30.2

## 2018-10-30 HISTORY — DX: Dermatitis, unspecified: L30.9

## 2018-10-30 HISTORY — PX: DILATATION & CURETTAGE/HYSTEROSCOPY WITH MYOSURE: SHX6511

## 2018-10-30 SURGERY — DILATATION & CURETTAGE/HYSTEROSCOPY WITH MYOSURE
Anesthesia: General

## 2018-10-30 MED ORDER — FENTANYL CITRATE (PF) 100 MCG/2ML IJ SOLN
INTRAMUSCULAR | Status: DC | PRN
  Administered 2018-10-30 (×2): 50 ug via INTRAVENOUS

## 2018-10-30 MED ORDER — FENTANYL CITRATE (PF) 100 MCG/2ML IJ SOLN
INTRAMUSCULAR | Status: AC
  Filled 2018-10-30: qty 2

## 2018-10-30 MED ORDER — SODIUM CHLORIDE 0.9 % IV SOLN
INTRAVENOUS | Status: AC
  Filled 2018-10-30: qty 2

## 2018-10-30 MED ORDER — MIDAZOLAM HCL 5 MG/5ML IJ SOLN
INTRAMUSCULAR | Status: DC | PRN
  Administered 2018-10-30: 2 mg via INTRAVENOUS

## 2018-10-30 MED ORDER — LIDOCAINE HCL (CARDIAC) PF 100 MG/5ML IV SOSY
PREFILLED_SYRINGE | INTRAVENOUS | Status: DC | PRN
  Administered 2018-10-30: 100 mg via INTRATRACHEAL

## 2018-10-30 MED ORDER — EPHEDRINE 5 MG/ML INJ
INTRAVENOUS | Status: AC
  Filled 2018-10-30: qty 10

## 2018-10-30 MED ORDER — PROPOFOL 10 MG/ML IV BOLUS
INTRAVENOUS | Status: AC
  Filled 2018-10-30: qty 20

## 2018-10-30 MED ORDER — LACTATED RINGERS IV SOLN
INTRAVENOUS | Status: DC
  Administered 2018-10-30: 10:00:00 via INTRAVENOUS
  Filled 2018-10-30: qty 1000

## 2018-10-30 MED ORDER — ACETAMINOPHEN 10 MG/ML IV SOLN
1000.0000 mg | Freq: Once | INTRAVENOUS | Status: DC | PRN
  Filled 2018-10-30: qty 100

## 2018-10-30 MED ORDER — LIDOCAINE HCL 1 % IJ SOLN
INTRAMUSCULAR | Status: DC | PRN
  Administered 2018-10-30: 10 mL

## 2018-10-30 MED ORDER — KETOROLAC TROMETHAMINE 30 MG/ML IJ SOLN
INTRAMUSCULAR | Status: DC | PRN
  Administered 2018-10-30: 30 mg via INTRAVENOUS

## 2018-10-30 MED ORDER — EPHEDRINE SULFATE 50 MG/ML IJ SOLN
INTRAMUSCULAR | Status: DC | PRN
  Administered 2018-10-30: 10 mg via INTRAVENOUS

## 2018-10-30 MED ORDER — DEXAMETHASONE SODIUM PHOSPHATE 4 MG/ML IJ SOLN
INTRAMUSCULAR | Status: DC | PRN
  Administered 2018-10-30: 8 mg via INTRAVENOUS

## 2018-10-30 MED ORDER — ONDANSETRON HCL 4 MG/2ML IJ SOLN
INTRAMUSCULAR | Status: AC
  Filled 2018-10-30: qty 2

## 2018-10-30 MED ORDER — MEPERIDINE HCL 25 MG/ML IJ SOLN
6.2500 mg | INTRAMUSCULAR | Status: DC | PRN
  Filled 2018-10-30: qty 1

## 2018-10-30 MED ORDER — SODIUM CHLORIDE 0.9 % IV SOLN
2.0000 g | INTRAVENOUS | Status: AC
  Administered 2018-10-30: 2 g via INTRAVENOUS
  Filled 2018-10-30: qty 2

## 2018-10-30 MED ORDER — MIDAZOLAM HCL 2 MG/2ML IJ SOLN
INTRAMUSCULAR | Status: AC
  Filled 2018-10-30: qty 2

## 2018-10-30 MED ORDER — DEXAMETHASONE SODIUM PHOSPHATE 10 MG/ML IJ SOLN
INTRAMUSCULAR | Status: AC
  Filled 2018-10-30: qty 1

## 2018-10-30 MED ORDER — HYDROMORPHONE HCL 1 MG/ML IJ SOLN
0.2500 mg | INTRAMUSCULAR | Status: DC | PRN
  Filled 2018-10-30: qty 0.5

## 2018-10-30 MED ORDER — PROPOFOL 10 MG/ML IV BOLUS
INTRAVENOUS | Status: DC | PRN
  Administered 2018-10-30: 150 mg via INTRAVENOUS

## 2018-10-30 MED ORDER — ONDANSETRON HCL 4 MG/2ML IJ SOLN
4.0000 mg | Freq: Once | INTRAMUSCULAR | Status: AC | PRN
  Administered 2018-10-30: 4 mg via INTRAVENOUS
  Filled 2018-10-30: qty 2

## 2018-10-30 MED ORDER — SODIUM CHLORIDE 0.9 % IR SOLN
Status: DC | PRN
  Administered 2018-10-30: 1

## 2018-10-30 MED ORDER — KETOROLAC TROMETHAMINE 30 MG/ML IJ SOLN
INTRAMUSCULAR | Status: AC
  Filled 2018-10-30: qty 1

## 2018-10-30 SURGICAL SUPPLY — 20 items
CANISTER SUCT 3000ML PPV (MISCELLANEOUS) ×2 IMPLANT
CATH ROBINSON RED A/P 16FR (CATHETERS) ×2 IMPLANT
COVER WAND RF STERILE (DRAPES) ×1 IMPLANT
DEVICE MYOSURE LITE (MISCELLANEOUS) IMPLANT
DEVICE MYOSURE REACH (MISCELLANEOUS) IMPLANT
DILATOR CANAL MILEX (MISCELLANEOUS) IMPLANT
GAUZE 4X4 16PLY RFD (DISPOSABLE) ×2 IMPLANT
GLOVE BIO SURGEON STRL SZ7.5 (GLOVE) ×3 IMPLANT
GOWN STRL REUS W/TWL XL LVL3 (GOWN DISPOSABLE) ×2 IMPLANT
IV NS IRRIG 3000ML ARTHROMATIC (IV SOLUTION) ×2 IMPLANT
KIT PROCEDURE FLUENT (KITS) ×2 IMPLANT
KIT TURNOVER CYSTO (KITS) ×2 IMPLANT
MYOSURE XL FIBROID (MISCELLANEOUS) ×2
PACK VAGINAL MINOR WOMEN LF (CUSTOM PROCEDURE TRAY) ×2 IMPLANT
PAD OB MATERNITY 4.3X12.25 (PERSONAL CARE ITEMS) ×2 IMPLANT
SEAL CERVICAL OMNI LOK (ABLATOR) IMPLANT
SEAL ROD LENS SCOPE MYOSURE (ABLATOR) ×2 IMPLANT
SYSTEM TISS REMOVAL MYOSURE XL (MISCELLANEOUS) IMPLANT
TOWEL OR 17X26 10 PK STRL BLUE (TOWEL DISPOSABLE) ×3 IMPLANT
WATER STERILE IRR 500ML POUR (IV SOLUTION) IMPLANT

## 2018-10-30 NOTE — Anesthesia Preprocedure Evaluation (Signed)
Anesthesia Evaluation  Patient identified by MRN, date of birth, ID band Patient awake    Reviewed: Allergy & Precautions, NPO status , Patient's Chart, lab work & pertinent test results  Airway Mallampati: II       Dental no notable dental hx. (+) Teeth Intact   Pulmonary asthma , Current Smoker,    Pulmonary exam normal breath sounds clear to auscultation       Cardiovascular negative cardio ROS Normal cardiovascular exam Rhythm:Regular Rate:Normal     Neuro/Psych negative neurological ROS  negative psych ROS   GI/Hepatic negative GI ROS, Neg liver ROS,   Endo/Other  negative endocrine ROS  Renal/GU negative Renal ROS  negative genitourinary   Musculoskeletal negative musculoskeletal ROS (+)   Abdominal (+) + obese,   Peds  Hematology negative hematology ROS (+)   Anesthesia Other Findings   Reproductive/Obstetrics                             Anesthesia Physical Anesthesia Plan  ASA: II  Anesthesia Plan: General   Post-op Pain Management:    Induction: Intravenous  PONV Risk Score and Plan: 4 or greater and Ondansetron, Dexamethasone, Midazolam and Scopolamine patch - Pre-op  Airway Management Planned: LMA  Additional Equipment:   Intra-op Plan:   Post-operative Plan: Extubation in OR  Informed Consent: I have reviewed the patients History and Physical, chart, labs and discussed the procedure including the risks, benefits and alternatives for the proposed anesthesia with the patient or authorized representative who has indicated his/her understanding and acceptance.     Dental advisory given  Plan Discussed with: CRNA  Anesthesia Plan Comments:         Anesthesia Quick Evaluation

## 2018-10-30 NOTE — Discharge Instructions (Signed)
° °  Postoperative Instructions Hysteroscopy D & C  Dr. Phineas Real and the nursing staff have discussed postoperative instructions with you.  If you have any questions please ask them before you leave the hospital, or call Dr Elisabeth Most office at [phone].    We would like to emphasize the following instructions:   ? Call the office to make your follow-up appointment as recommended by Dr Phineas Real (usually 1-2 weeks).  ? You were given a prescription, or one was ordered for you at the pharmacy you designated.  Get that prescription filled and take the medication according to instructions.  ? You may eat a regular diet, but slowly until you start having bowel movements.  ? Drink plenty of water daily.  ? Nothing in the vagina (intercourse, douching, objects of any kind) for two weeks.  When reinitiating intercourse, if it is uncomfortable, stop and make an appointment with Dr Phineas Real to be evaluated.  ? No driving for one to two days until the effects of anesthesia has worn off.  No traveling out of town for several days.  ? You may shower, but no baths for one week.  Walking up and down stairs is ok.  No heavy lifting, prolonged standing, repeated bending or any working out until your post op check.  ? Rest frequently, listen to your body and do not push yourself and overdo it.  ? Call if:  o Your pain medication does not seem strong enough. o Worsening pain or abdominal bloating o Persistent nausea or vomiting o Difficulty with urination or bowel movements. o Temperature of 101 degrees or higher. o Heavy vaginal bleeding.  If your period is due, you may use tampons. o You have any questions or concerns   Post Anesthesia Home Care Instructions  Activity: Get plenty of rest for the remainder of the day. A responsible individual must stay with you for 24 hours following the procedure.  For the next 24 hours, DO NOT: -Drive a car -Paediatric nurse -Drink alcoholic  beverages -Take any medication unless instructed by your physician -Make any legal decisions or sign important papers.  Meals: Start with liquid foods such as gelatin or soup. Progress to regular foods as tolerated. Avoid greasy, spicy, heavy foods. If nausea and/or vomiting occur, drink only clear liquids until the nausea and/or vomiting subsides. Call your physician if vomiting continues.  Special Instructions/Symptoms: Your throat may feel dry or sore from the anesthesia or the breathing tube placed in your throat during surgery. If this causes discomfort, gargle with warm salt water. The discomfort should disappear within 24 hours.  May take Ibuprofen, Advil, Motrin, or Aleve after 5:15 PM.

## 2018-10-30 NOTE — Anesthesia Postprocedure Evaluation (Signed)
Anesthesia Post Note  Patient: Barbara Espinoza  Procedure(s) Performed: DILATATION & CURETTAGE/HYSTEROSCOPY WITH MYOSURE (N/A )     Patient location during evaluation: Phase II Anesthesia Type: General Level of consciousness: awake Pain management: pain level controlled Vital Signs Assessment: post-procedure vital signs reviewed and stable Respiratory status: spontaneous breathing Cardiovascular status: stable Postop Assessment: no apparent nausea or vomiting Anesthetic complications: no    Last Vitals:  Vitals:   10/30/18 1200 10/30/18 1238  BP: (!) 151/91 (!) 160/81  Pulse: 61 (!) 58  Resp: 16 15  Temp:  36.7 C  SpO2: 98% 100%    Last Pain:  Vitals:   10/30/18 1243  TempSrc:   PainSc: 0-No pain   Pain Goal: Patients Stated Pain Goal: 2 (10/30/18 0715)                 Huston Foley

## 2018-10-30 NOTE — Transfer of Care (Signed)
  Last Vitals:  Vitals Value Taken Time  BP 150/101 10/30/2018 11:15 AM  Temp 36.5 C 10/30/2018 11:15 AM  Pulse 74 10/30/2018 11:18 AM  Resp 14 10/30/2018 11:18 AM  SpO2 100 % 10/30/2018 11:18 AM  Vitals shown include unvalidated device data.  Last Pain:  Vitals:   10/30/18 0715  TempSrc: Oral  PainSc: 0-No pain      Patients Stated Pain Goal: 2 (10/30/18 0715)  Immediate Anesthesia Transfer of Care Note  Patient: Barbara Espinoza  Procedure(s) Performed: Procedure(s) (LRB): DILATATION & CURETTAGE/HYSTEROSCOPY WITH MYOSURE (N/A)  Patient Location: PACU  Anesthesia Type: General  Level of Consciousness: awake, alert  and oriented  Airway & Oxygen Therapy: Patient Spontanous Breathing and Patient connected to face mask oxygen  Post-op Assessment: Report given to PACU RN and Post -op Vital signs reviewed and stable  Post vital signs: Reviewed and stable  Complications: No apparent anesthesia complications

## 2018-10-30 NOTE — H&P (Signed)
  The patient was examined.  I reviewed the proposed surgery and consent form with the patient.  The dictated history and physical is current and accurate and all questions were answered. The patient is ready to proceed with surgery and has a realistic understanding and expectation for the outcome.   Anastasio Auerbach MD, 9:48 AM 10/30/2018

## 2018-10-30 NOTE — Op Note (Signed)
ARGIE LOBER 07-Sep-1970 [id number]   Post Operative Note   Date of surgery:  10/30/2018  Pre Op Dx: Endometrial polyp, leiomyoma  Post Op Dx: Endometrial polyp, leiomyoma  Procedure: Hysteroscopy D&C resection of endometrial polyp x2  Surgeon:  Belinda Block Miner Koral  Anesthesia:  General  EBL: 10 cc  Distended media discrepancy: 465 cc saline  Complications:  None  Specimen: #1 endometrial curetting #2 endometrial polyps to pathology  Findings: EUA: External BUS vagina normal.  Cervix normal.  Uterus grossly normal size retroverted   Hysteroscopy: Adequate noting fundus, right/left tubal ostia, anterior/posterior endometrial surfaces, lower uterine segment and endocervical canal all visualized.  2 fingerlike polyps from the left lateral endometrial surface resected in their entirety to the level of the surrounding endometrium.  No evidence of submucosal myoma.  Initial hysteroscopy with arcuate type pattern.  Probing of the midline separated the adhesive like septum to a more normal symmetrical cavity.  Procedure:  The patient was taken to the operating room, was placed in the low dorsal lithotomy position, underwent general anesthesia, received a perineal/vaginal preparation per nursing personnel and the bladder was emptied with an in and out Foley catheterization. The timeout was performed by the surgical team. An EUA was performed. The patient was draped in the usual fashion. The cervix was visualized with a speculum, anterior lip grasped with a single-tooth tenaculum and a paracervical block was placed using 10 cc's of 1% lidocaine. The cervix was gently dilated to admit the Myosure XL hysteroscope and hysteroscopy was performed with findings noted above. Using the Myosure XL resectoscopic wand the polyps were resected in their entirety to the level the surrounding endometrium.  XL wand probing of the midline area separated the adhesive like septum resulting in a more symmetrical  cavity.  There was no evidence of submucous myoma intrusion into the cavity.  A gentle sharp curettage was performed. Both specimens were sent separately to pathology.  Repeat hysteroscopy showed an empty cavity with good distention and no evidence of perforation. The instruments were removed and adequate hemostasis was visualized at the tenaculum site and external cervical os.  The specimens were identified for pathology.  The sponge, needle and instrument count were verified correct.  Patient was wanded per protocol.  The patient was given intraoperative Toradol, was awakened without difficulty and was taken to the recovery room in good condition having tolerated the procedure well.     Anastasio Auerbach MD, 11:07 AM 10/30/2018

## 2018-10-30 NOTE — Progress Notes (Signed)
SPOKE W/  _ [place]    SCREENING SYMPTOMS OF COVID 19:   COUGH--no  RUNNY NOSE--- allergies and asthma (yes)  SORE THROAT---no  NASAL CONGESTION----yes due to allergies  SNEEZING----no  SHORTNESS OF BREATH---yes with asthma  DIFFICULTY BREATHING---no  TEMP >100.0 -----no  UNEXPLAINED BODY ACHES------no  CHILLS -------- no  HEADACHES ---------no  LOSS OF SMELL/ TASTE --------no    HAVE YOU OR ANY FAMILY MEMBER TRAVELLED PAST 14 DAYS OUT OF THE   COUNTY---no STATE----no COUNTRY----no  HAVE YOU OR ANY FAMILY MEMBER BEEN EXPOSED TO ANYONE WITH COVID 19? no

## 2018-10-30 NOTE — Anesthesia Procedure Notes (Signed)
Procedure Name: LMA Insertion Date/Time: 10/30/2018 10:45 AM Performed by: Lyn Hollingshead, MD Pre-anesthesia Checklist: Patient identified, Emergency Drugs available, Suction available and Patient being monitored Patient Re-evaluated:Patient Re-evaluated prior to induction Oxygen Delivery Method: Circle system utilized Preoxygenation: Pre-oxygenation with 100% oxygen Induction Type: IV induction Ventilation: Mask ventilation without difficulty LMA: LMA inserted LMA Size: 4.0 Number of attempts: 1 Airway Equipment and Method: Bite block Placement Confirmation: positive ETCO2 Tube secured with: Tape Dental Injury: Teeth and Oropharynx as per pre-operative assessment

## 2018-10-31 ENCOUNTER — Encounter (HOSPITAL_BASED_OUTPATIENT_CLINIC_OR_DEPARTMENT_OTHER): Payer: Self-pay | Admitting: Gynecology

## 2018-11-02 ENCOUNTER — Telehealth: Payer: Self-pay | Admitting: *Deleted

## 2018-11-02 DIAGNOSIS — Z79899 Other long term (current) drug therapy: Secondary | ICD-10-CM | POA: Diagnosis not present

## 2018-11-02 DIAGNOSIS — K210 Gastro-esophageal reflux disease with esophagitis: Secondary | ICD-10-CM | POA: Diagnosis not present

## 2018-11-02 DIAGNOSIS — L209 Atopic dermatitis, unspecified: Secondary | ICD-10-CM | POA: Diagnosis not present

## 2018-11-02 DIAGNOSIS — J4541 Moderate persistent asthma with (acute) exacerbation: Secondary | ICD-10-CM | POA: Diagnosis not present

## 2018-11-02 NOTE — Telephone Encounter (Signed)
Patient informed. 

## 2018-11-02 NOTE — Telephone Encounter (Signed)
Patient post D&C on 10/30/18 doing well,however in last 24 hours reports diarrhea,asked for your recommendation about what to take for this? Please advise

## 2018-11-02 NOTE — Telephone Encounter (Signed)
Any of the OTC antidiarrheals

## 2018-12-24 DIAGNOSIS — Z20828 Contact with and (suspected) exposure to other viral communicable diseases: Secondary | ICD-10-CM | POA: Diagnosis not present

## 2018-12-24 DIAGNOSIS — J45909 Unspecified asthma, uncomplicated: Secondary | ICD-10-CM | POA: Diagnosis not present

## 2018-12-24 DIAGNOSIS — J3489 Other specified disorders of nose and nasal sinuses: Secondary | ICD-10-CM | POA: Diagnosis not present

## 2019-01-31 DIAGNOSIS — L2089 Other atopic dermatitis: Secondary | ICD-10-CM | POA: Diagnosis not present

## 2019-01-31 DIAGNOSIS — L011 Impetiginization of other dermatoses: Secondary | ICD-10-CM | POA: Diagnosis not present

## 2019-02-01 DIAGNOSIS — Z79899 Other long term (current) drug therapy: Secondary | ICD-10-CM | POA: Diagnosis not present

## 2019-02-01 DIAGNOSIS — K210 Gastro-esophageal reflux disease with esophagitis: Secondary | ICD-10-CM | POA: Diagnosis not present

## 2019-02-01 DIAGNOSIS — J4541 Moderate persistent asthma with (acute) exacerbation: Secondary | ICD-10-CM | POA: Diagnosis not present

## 2019-02-01 DIAGNOSIS — L209 Atopic dermatitis, unspecified: Secondary | ICD-10-CM | POA: Diagnosis not present

## 2019-02-14 ENCOUNTER — Encounter: Payer: Self-pay | Admitting: Gynecology

## 2019-02-16 DIAGNOSIS — L218 Other seborrheic dermatitis: Secondary | ICD-10-CM | POA: Diagnosis not present

## 2019-02-16 DIAGNOSIS — L2089 Other atopic dermatitis: Secondary | ICD-10-CM | POA: Diagnosis not present

## 2019-04-09 DIAGNOSIS — Z1231 Encounter for screening mammogram for malignant neoplasm of breast: Secondary | ICD-10-CM | POA: Diagnosis not present

## 2019-05-07 DIAGNOSIS — L218 Other seborrheic dermatitis: Secondary | ICD-10-CM | POA: Diagnosis not present

## 2019-05-07 DIAGNOSIS — L2089 Other atopic dermatitis: Secondary | ICD-10-CM | POA: Diagnosis not present

## 2019-06-21 DIAGNOSIS — R21 Rash and other nonspecific skin eruption: Secondary | ICD-10-CM | POA: Diagnosis not present

## 2019-06-21 DIAGNOSIS — Z5181 Encounter for therapeutic drug level monitoring: Secondary | ICD-10-CM | POA: Diagnosis not present

## 2019-06-21 DIAGNOSIS — L209 Atopic dermatitis, unspecified: Secondary | ICD-10-CM | POA: Diagnosis not present

## 2019-06-21 DIAGNOSIS — L408 Other psoriasis: Secondary | ICD-10-CM | POA: Diagnosis not present

## 2019-06-21 DIAGNOSIS — Z79899 Other long term (current) drug therapy: Secondary | ICD-10-CM | POA: Diagnosis not present

## 2019-07-04 DIAGNOSIS — M5386 Other specified dorsopathies, lumbar region: Secondary | ICD-10-CM | POA: Diagnosis not present

## 2019-07-04 DIAGNOSIS — M5431 Sciatica, right side: Secondary | ICD-10-CM | POA: Diagnosis not present

## 2019-07-04 DIAGNOSIS — M9903 Segmental and somatic dysfunction of lumbar region: Secondary | ICD-10-CM | POA: Diagnosis not present

## 2019-07-04 DIAGNOSIS — M9902 Segmental and somatic dysfunction of thoracic region: Secondary | ICD-10-CM | POA: Diagnosis not present

## 2019-07-17 DIAGNOSIS — M9902 Segmental and somatic dysfunction of thoracic region: Secondary | ICD-10-CM | POA: Diagnosis not present

## 2019-07-17 DIAGNOSIS — M9903 Segmental and somatic dysfunction of lumbar region: Secondary | ICD-10-CM | POA: Diagnosis not present

## 2019-07-17 DIAGNOSIS — M5386 Other specified dorsopathies, lumbar region: Secondary | ICD-10-CM | POA: Diagnosis not present

## 2019-07-17 DIAGNOSIS — M5431 Sciatica, right side: Secondary | ICD-10-CM | POA: Diagnosis not present

## 2019-07-18 DIAGNOSIS — L400 Psoriasis vulgaris: Secondary | ICD-10-CM | POA: Diagnosis not present

## 2019-07-18 DIAGNOSIS — L280 Lichen simplex chronicus: Secondary | ICD-10-CM | POA: Diagnosis not present

## 2019-07-25 DIAGNOSIS — M5431 Sciatica, right side: Secondary | ICD-10-CM | POA: Diagnosis not present

## 2019-07-25 DIAGNOSIS — M5386 Other specified dorsopathies, lumbar region: Secondary | ICD-10-CM | POA: Diagnosis not present

## 2019-07-25 DIAGNOSIS — M9903 Segmental and somatic dysfunction of lumbar region: Secondary | ICD-10-CM | POA: Diagnosis not present

## 2019-07-25 DIAGNOSIS — M9902 Segmental and somatic dysfunction of thoracic region: Secondary | ICD-10-CM | POA: Diagnosis not present

## 2019-07-31 DIAGNOSIS — M9903 Segmental and somatic dysfunction of lumbar region: Secondary | ICD-10-CM | POA: Diagnosis not present

## 2019-07-31 DIAGNOSIS — M9902 Segmental and somatic dysfunction of thoracic region: Secondary | ICD-10-CM | POA: Diagnosis not present

## 2019-07-31 DIAGNOSIS — M5386 Other specified dorsopathies, lumbar region: Secondary | ICD-10-CM | POA: Diagnosis not present

## 2019-07-31 DIAGNOSIS — M5431 Sciatica, right side: Secondary | ICD-10-CM | POA: Diagnosis not present

## 2019-08-02 DIAGNOSIS — L209 Atopic dermatitis, unspecified: Secondary | ICD-10-CM | POA: Diagnosis not present

## 2019-08-02 DIAGNOSIS — Z5181 Encounter for therapeutic drug level monitoring: Secondary | ICD-10-CM | POA: Diagnosis not present

## 2019-08-02 DIAGNOSIS — Z79899 Other long term (current) drug therapy: Secondary | ICD-10-CM | POA: Diagnosis not present

## 2019-08-02 DIAGNOSIS — L409 Psoriasis, unspecified: Secondary | ICD-10-CM | POA: Diagnosis not present

## 2019-08-02 DIAGNOSIS — L400 Psoriasis vulgaris: Secondary | ICD-10-CM | POA: Diagnosis not present

## 2019-08-07 DIAGNOSIS — M5386 Other specified dorsopathies, lumbar region: Secondary | ICD-10-CM | POA: Diagnosis not present

## 2019-08-07 DIAGNOSIS — M9902 Segmental and somatic dysfunction of thoracic region: Secondary | ICD-10-CM | POA: Diagnosis not present

## 2019-08-07 DIAGNOSIS — M9903 Segmental and somatic dysfunction of lumbar region: Secondary | ICD-10-CM | POA: Diagnosis not present

## 2019-08-07 DIAGNOSIS — M5431 Sciatica, right side: Secondary | ICD-10-CM | POA: Diagnosis not present

## 2019-08-14 DIAGNOSIS — M9903 Segmental and somatic dysfunction of lumbar region: Secondary | ICD-10-CM | POA: Diagnosis not present

## 2019-08-14 DIAGNOSIS — M9902 Segmental and somatic dysfunction of thoracic region: Secondary | ICD-10-CM | POA: Diagnosis not present

## 2019-08-14 DIAGNOSIS — M5431 Sciatica, right side: Secondary | ICD-10-CM | POA: Diagnosis not present

## 2019-08-14 DIAGNOSIS — M5386 Other specified dorsopathies, lumbar region: Secondary | ICD-10-CM | POA: Diagnosis not present

## 2019-08-20 DIAGNOSIS — L209 Atopic dermatitis, unspecified: Secondary | ICD-10-CM | POA: Diagnosis not present

## 2019-08-20 DIAGNOSIS — Z79899 Other long term (current) drug therapy: Secondary | ICD-10-CM | POA: Diagnosis not present

## 2019-08-20 DIAGNOSIS — K2100 Gastro-esophageal reflux disease with esophagitis, without bleeding: Secondary | ICD-10-CM | POA: Diagnosis not present

## 2019-08-20 DIAGNOSIS — J4541 Moderate persistent asthma with (acute) exacerbation: Secondary | ICD-10-CM | POA: Diagnosis not present

## 2019-08-21 DIAGNOSIS — M5386 Other specified dorsopathies, lumbar region: Secondary | ICD-10-CM | POA: Diagnosis not present

## 2019-08-21 DIAGNOSIS — M9903 Segmental and somatic dysfunction of lumbar region: Secondary | ICD-10-CM | POA: Diagnosis not present

## 2019-08-21 DIAGNOSIS — M9902 Segmental and somatic dysfunction of thoracic region: Secondary | ICD-10-CM | POA: Diagnosis not present

## 2019-08-21 DIAGNOSIS — M5431 Sciatica, right side: Secondary | ICD-10-CM | POA: Diagnosis not present

## 2019-08-25 DIAGNOSIS — Z03818 Encounter for observation for suspected exposure to other biological agents ruled out: Secondary | ICD-10-CM | POA: Diagnosis not present

## 2019-08-25 DIAGNOSIS — Z20828 Contact with and (suspected) exposure to other viral communicable diseases: Secondary | ICD-10-CM | POA: Diagnosis not present

## 2019-10-23 DIAGNOSIS — K2100 Gastro-esophageal reflux disease with esophagitis, without bleeding: Secondary | ICD-10-CM | POA: Diagnosis not present

## 2019-10-23 DIAGNOSIS — L209 Atopic dermatitis, unspecified: Secondary | ICD-10-CM | POA: Diagnosis not present

## 2019-10-23 DIAGNOSIS — M7918 Myalgia, other site: Secondary | ICD-10-CM | POA: Diagnosis not present

## 2019-10-23 DIAGNOSIS — J4541 Moderate persistent asthma with (acute) exacerbation: Secondary | ICD-10-CM | POA: Diagnosis not present

## 2019-11-13 DIAGNOSIS — M5386 Other specified dorsopathies, lumbar region: Secondary | ICD-10-CM | POA: Diagnosis not present

## 2019-11-13 DIAGNOSIS — M9903 Segmental and somatic dysfunction of lumbar region: Secondary | ICD-10-CM | POA: Diagnosis not present

## 2019-11-13 DIAGNOSIS — M5431 Sciatica, right side: Secondary | ICD-10-CM | POA: Diagnosis not present

## 2019-11-13 DIAGNOSIS — M9902 Segmental and somatic dysfunction of thoracic region: Secondary | ICD-10-CM | POA: Diagnosis not present

## 2019-12-19 DIAGNOSIS — M9903 Segmental and somatic dysfunction of lumbar region: Secondary | ICD-10-CM | POA: Diagnosis not present

## 2019-12-19 DIAGNOSIS — M5431 Sciatica, right side: Secondary | ICD-10-CM | POA: Diagnosis not present

## 2019-12-19 DIAGNOSIS — M9902 Segmental and somatic dysfunction of thoracic region: Secondary | ICD-10-CM | POA: Diagnosis not present

## 2019-12-19 DIAGNOSIS — M5386 Other specified dorsopathies, lumbar region: Secondary | ICD-10-CM | POA: Diagnosis not present

## 2019-12-26 DIAGNOSIS — Z5181 Encounter for therapeutic drug level monitoring: Secondary | ICD-10-CM | POA: Diagnosis not present

## 2019-12-26 DIAGNOSIS — L2084 Intrinsic (allergic) eczema: Secondary | ICD-10-CM | POA: Diagnosis not present

## 2019-12-26 DIAGNOSIS — L409 Psoriasis, unspecified: Secondary | ICD-10-CM | POA: Diagnosis not present

## 2019-12-31 DIAGNOSIS — J4541 Moderate persistent asthma with (acute) exacerbation: Secondary | ICD-10-CM | POA: Diagnosis not present

## 2019-12-31 DIAGNOSIS — K2100 Gastro-esophageal reflux disease with esophagitis, without bleeding: Secondary | ICD-10-CM | POA: Diagnosis not present

## 2019-12-31 DIAGNOSIS — L209 Atopic dermatitis, unspecified: Secondary | ICD-10-CM | POA: Diagnosis not present

## 2019-12-31 DIAGNOSIS — Z79899 Other long term (current) drug therapy: Secondary | ICD-10-CM | POA: Diagnosis not present

## 2020-01-13 DIAGNOSIS — Z20822 Contact with and (suspected) exposure to covid-19: Secondary | ICD-10-CM | POA: Diagnosis not present

## 2020-01-14 DIAGNOSIS — L209 Atopic dermatitis, unspecified: Secondary | ICD-10-CM | POA: Diagnosis not present

## 2020-01-14 DIAGNOSIS — J4541 Moderate persistent asthma with (acute) exacerbation: Secondary | ICD-10-CM | POA: Diagnosis not present

## 2020-01-14 DIAGNOSIS — K2100 Gastro-esophageal reflux disease with esophagitis, without bleeding: Secondary | ICD-10-CM | POA: Diagnosis not present

## 2020-01-14 DIAGNOSIS — Z79899 Other long term (current) drug therapy: Secondary | ICD-10-CM | POA: Diagnosis not present

## 2020-01-24 DIAGNOSIS — L409 Psoriasis, unspecified: Secondary | ICD-10-CM | POA: Diagnosis not present

## 2020-01-29 DIAGNOSIS — L409 Psoriasis, unspecified: Secondary | ICD-10-CM | POA: Diagnosis not present

## 2020-01-31 DIAGNOSIS — L209 Atopic dermatitis, unspecified: Secondary | ICD-10-CM | POA: Diagnosis not present

## 2020-02-06 DIAGNOSIS — L209 Atopic dermatitis, unspecified: Secondary | ICD-10-CM | POA: Diagnosis not present

## 2020-02-08 DIAGNOSIS — L209 Atopic dermatitis, unspecified: Secondary | ICD-10-CM | POA: Diagnosis not present

## 2020-02-19 DIAGNOSIS — L209 Atopic dermatitis, unspecified: Secondary | ICD-10-CM | POA: Diagnosis not present

## 2020-02-19 DIAGNOSIS — K2100 Gastro-esophageal reflux disease with esophagitis, without bleeding: Secondary | ICD-10-CM | POA: Diagnosis not present

## 2020-02-19 DIAGNOSIS — J309 Allergic rhinitis, unspecified: Secondary | ICD-10-CM | POA: Diagnosis not present

## 2020-02-19 DIAGNOSIS — J4541 Moderate persistent asthma with (acute) exacerbation: Secondary | ICD-10-CM | POA: Diagnosis not present

## 2020-02-26 DIAGNOSIS — L209 Atopic dermatitis, unspecified: Secondary | ICD-10-CM | POA: Diagnosis not present

## 2020-03-08 DIAGNOSIS — Z20822 Contact with and (suspected) exposure to covid-19: Secondary | ICD-10-CM | POA: Diagnosis not present

## 2020-03-20 DIAGNOSIS — K2100 Gastro-esophageal reflux disease with esophagitis, without bleeding: Secondary | ICD-10-CM | POA: Diagnosis not present

## 2020-03-20 DIAGNOSIS — Z79899 Other long term (current) drug therapy: Secondary | ICD-10-CM | POA: Diagnosis not present

## 2020-03-20 DIAGNOSIS — J4541 Moderate persistent asthma with (acute) exacerbation: Secondary | ICD-10-CM | POA: Diagnosis not present

## 2020-03-20 DIAGNOSIS — L209 Atopic dermatitis, unspecified: Secondary | ICD-10-CM | POA: Diagnosis not present

## 2020-03-27 DIAGNOSIS — L209 Atopic dermatitis, unspecified: Secondary | ICD-10-CM | POA: Diagnosis not present

## 2020-04-14 DIAGNOSIS — M25642 Stiffness of left hand, not elsewhere classified: Secondary | ICD-10-CM | POA: Diagnosis not present

## 2020-04-14 DIAGNOSIS — M20032 Swan-neck deformity of left finger(s): Secondary | ICD-10-CM | POA: Diagnosis not present

## 2020-04-14 DIAGNOSIS — M20012 Mallet finger of left finger(s): Secondary | ICD-10-CM | POA: Diagnosis not present

## 2020-04-14 DIAGNOSIS — M79645 Pain in left finger(s): Secondary | ICD-10-CM | POA: Diagnosis not present

## 2020-05-12 DIAGNOSIS — M25642 Stiffness of left hand, not elsewhere classified: Secondary | ICD-10-CM | POA: Diagnosis not present

## 2020-05-12 DIAGNOSIS — M79645 Pain in left finger(s): Secondary | ICD-10-CM | POA: Diagnosis not present

## 2020-05-12 DIAGNOSIS — M20032 Swan-neck deformity of left finger(s): Secondary | ICD-10-CM | POA: Diagnosis not present

## 2020-05-12 DIAGNOSIS — M20012 Mallet finger of left finger(s): Secondary | ICD-10-CM | POA: Diagnosis not present

## 2020-06-12 DIAGNOSIS — Z79899 Other long term (current) drug therapy: Secondary | ICD-10-CM | POA: Diagnosis not present

## 2020-06-12 DIAGNOSIS — J4520 Mild intermittent asthma, uncomplicated: Secondary | ICD-10-CM | POA: Diagnosis not present

## 2020-06-12 DIAGNOSIS — L209 Atopic dermatitis, unspecified: Secondary | ICD-10-CM | POA: Diagnosis not present

## 2020-06-12 DIAGNOSIS — K2100 Gastro-esophageal reflux disease with esophagitis, without bleeding: Secondary | ICD-10-CM | POA: Diagnosis not present

## 2020-06-18 DIAGNOSIS — M79645 Pain in left finger(s): Secondary | ICD-10-CM | POA: Diagnosis not present

## 2020-06-18 DIAGNOSIS — M25642 Stiffness of left hand, not elsewhere classified: Secondary | ICD-10-CM | POA: Diagnosis not present

## 2020-06-18 DIAGNOSIS — M20012 Mallet finger of left finger(s): Secondary | ICD-10-CM | POA: Diagnosis not present

## 2020-06-18 DIAGNOSIS — M20032 Swan-neck deformity of left finger(s): Secondary | ICD-10-CM | POA: Diagnosis not present

## 2020-07-16 DIAGNOSIS — M20012 Mallet finger of left finger(s): Secondary | ICD-10-CM | POA: Diagnosis not present

## 2020-07-29 DIAGNOSIS — M79645 Pain in left finger(s): Secondary | ICD-10-CM | POA: Diagnosis not present

## 2020-07-29 DIAGNOSIS — M25642 Stiffness of left hand, not elsewhere classified: Secondary | ICD-10-CM | POA: Diagnosis not present

## 2020-07-29 DIAGNOSIS — M20012 Mallet finger of left finger(s): Secondary | ICD-10-CM | POA: Diagnosis not present

## 2020-08-13 DIAGNOSIS — M20032 Swan-neck deformity of left finger(s): Secondary | ICD-10-CM | POA: Diagnosis not present

## 2020-08-13 DIAGNOSIS — M25642 Stiffness of left hand, not elsewhere classified: Secondary | ICD-10-CM | POA: Diagnosis not present

## 2020-08-13 DIAGNOSIS — M20012 Mallet finger of left finger(s): Secondary | ICD-10-CM | POA: Diagnosis not present

## 2020-08-13 DIAGNOSIS — M79645 Pain in left finger(s): Secondary | ICD-10-CM | POA: Diagnosis not present

## 2020-09-15 DIAGNOSIS — J4541 Moderate persistent asthma with (acute) exacerbation: Secondary | ICD-10-CM | POA: Diagnosis not present

## 2020-09-15 DIAGNOSIS — K2100 Gastro-esophageal reflux disease with esophagitis, without bleeding: Secondary | ICD-10-CM | POA: Diagnosis not present

## 2020-09-15 DIAGNOSIS — L209 Atopic dermatitis, unspecified: Secondary | ICD-10-CM | POA: Diagnosis not present

## 2020-09-15 DIAGNOSIS — Z79899 Other long term (current) drug therapy: Secondary | ICD-10-CM | POA: Diagnosis not present

## 2020-10-06 IMAGING — US US EXTREM LOW VENOUS BILAT
1 series · 13 of 24 positions shown · non-contrast
Comparison: None.

CLINICAL DATA: Bilateral lower extremity swelling



[Series 1: us extrem low venous bilat · 0.07mm/px · 13 of 57 slices shown]
[im 1/57]
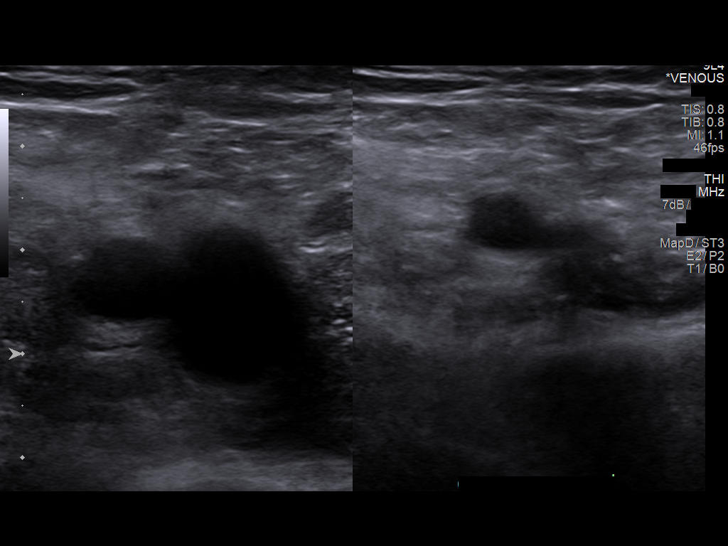
[im 5/57]
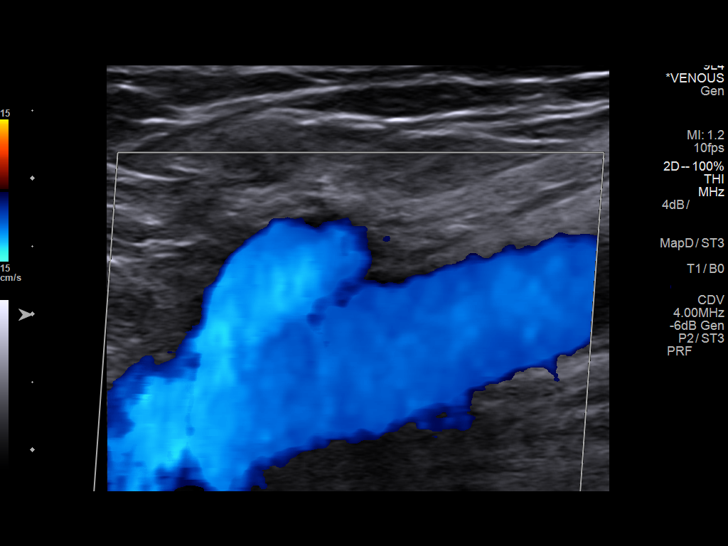
[im 10/57]
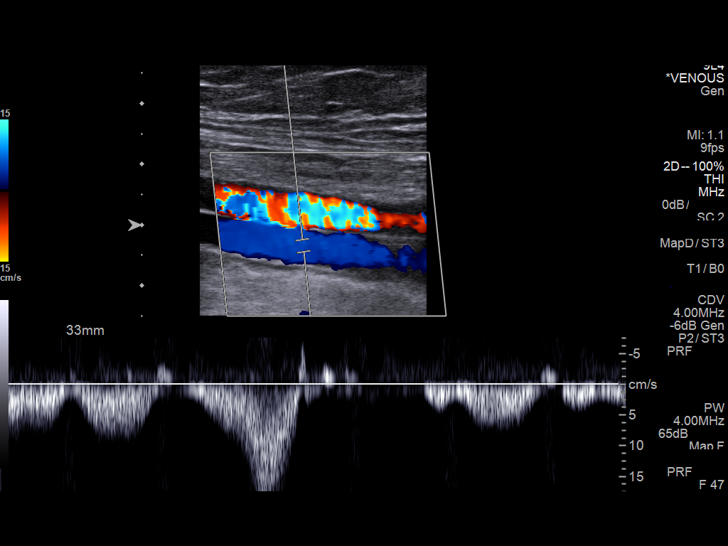
[im 15/57]
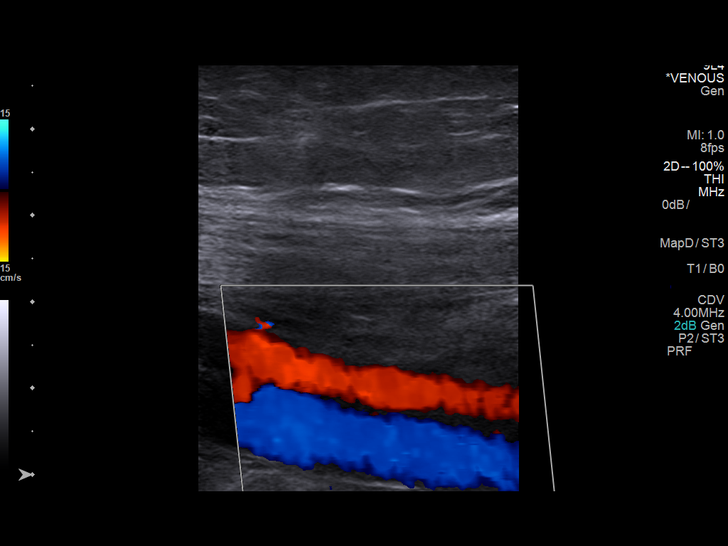
[im 20/57]
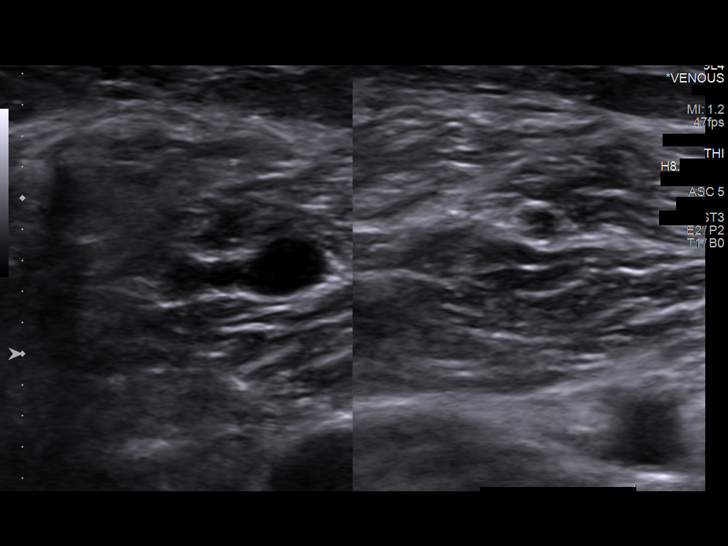
[im 25/57]
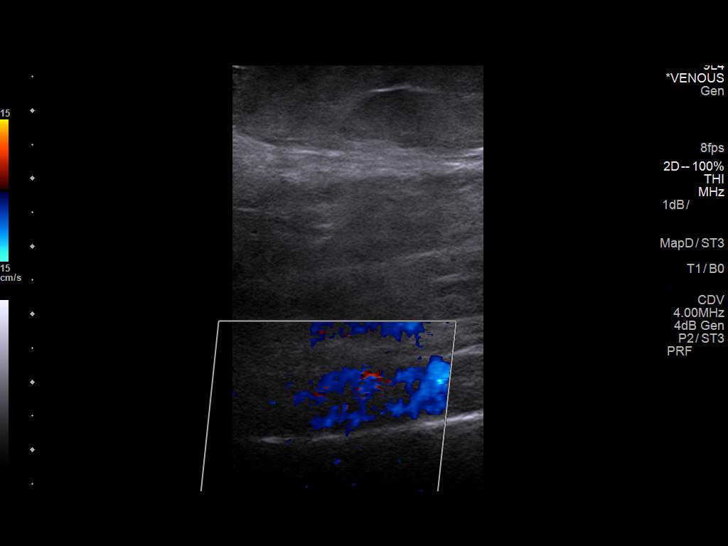
[im 30/57]
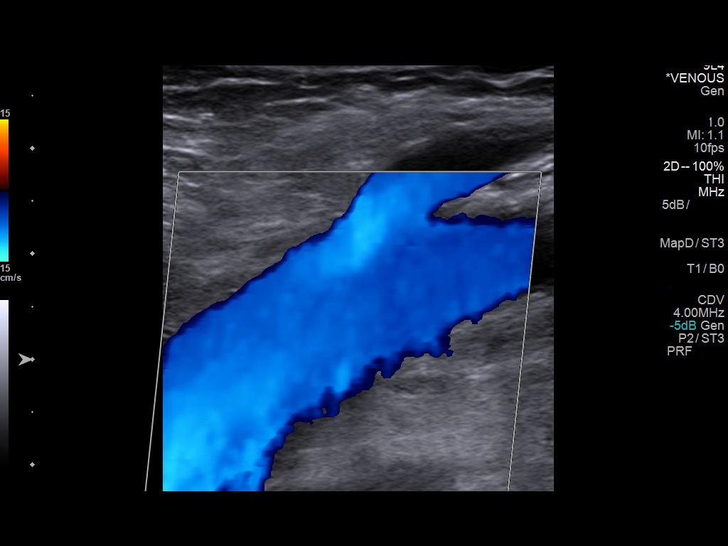
[im 32/57]
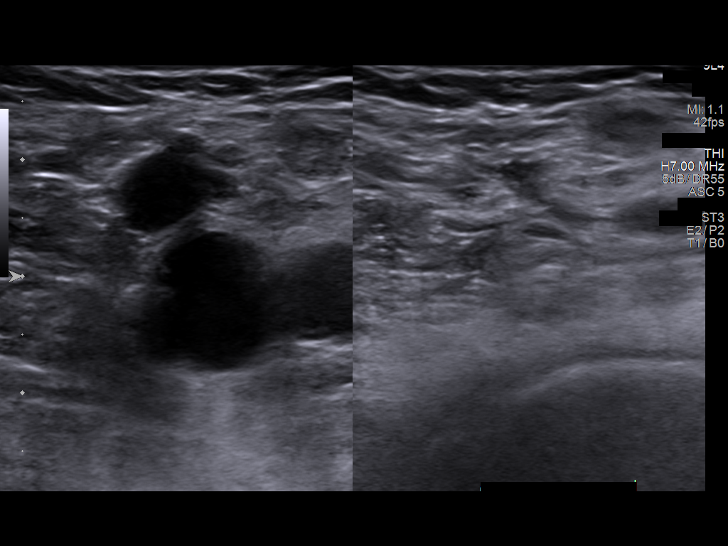
[im 37/57]
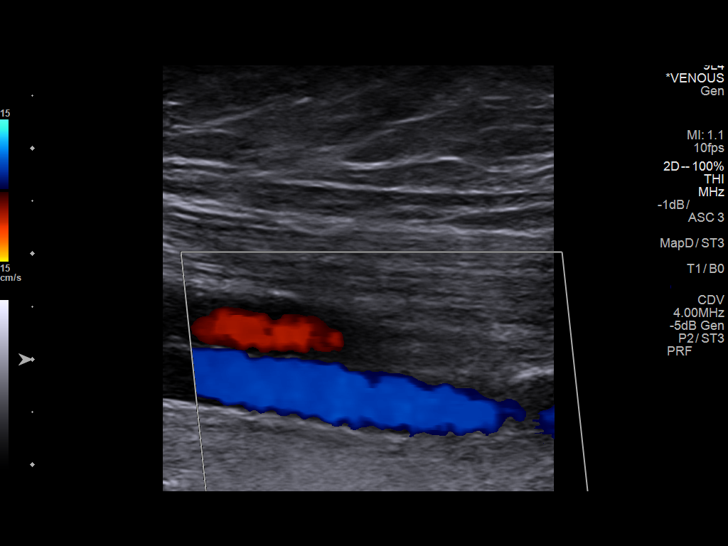
[im 42/57]
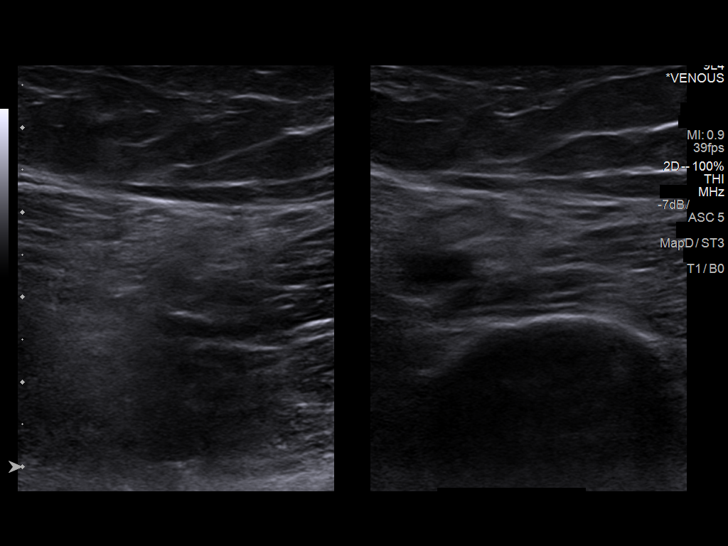
[im 47/57]
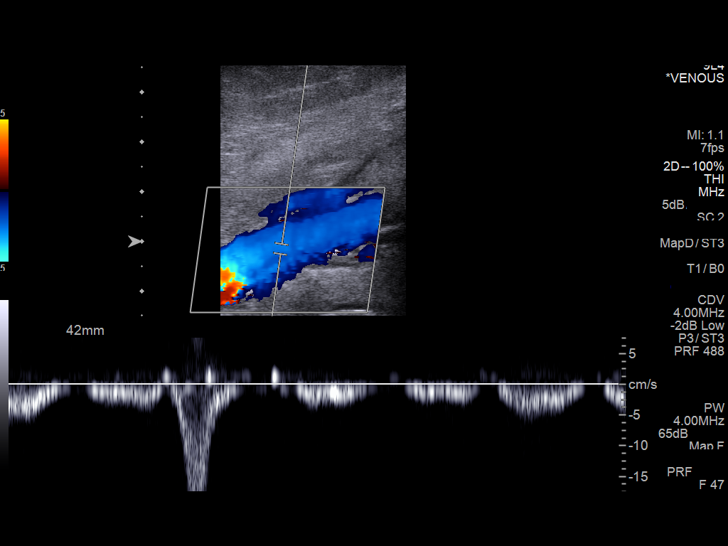
[im 52/57]
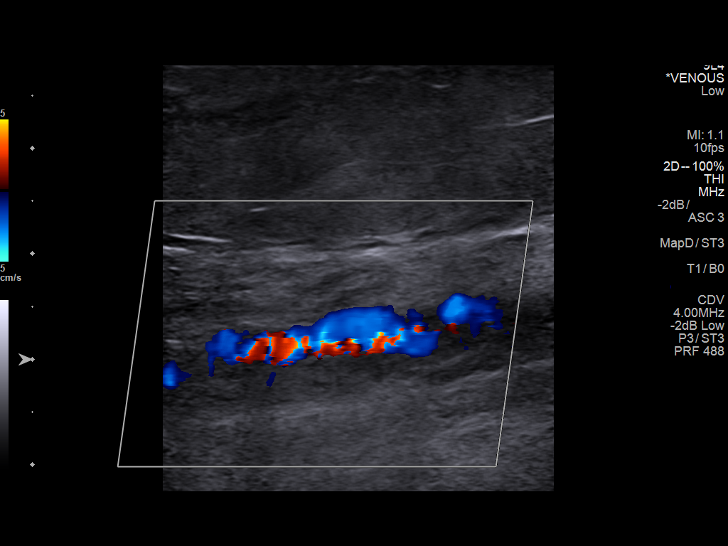
[im 57/57]
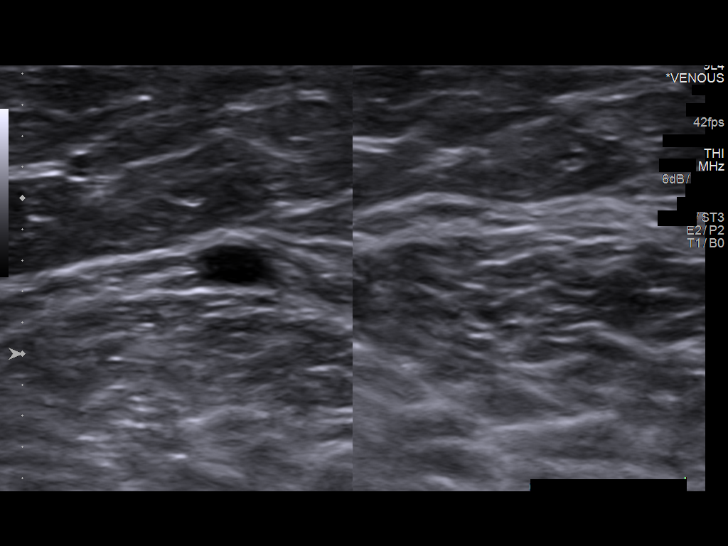

[13 of 24 positions shown; findings below may reference images not displayed]

FINDINGS: RIGHT LOWER EXTREMITY

Common Femoral Vein: No evidence of thrombus. Normal
compressibility, respiratory phasicity and response to augmentation.

Saphenofemoral Junction: No evidence of thrombus. Normal
compressibility and flow on color Doppler imaging.

Profunda Femoral Vein: No evidence of thrombus. Normal
compressibility and flow on color Doppler imaging.

Femoral Vein: No evidence of thrombus. Normal compressibility,
respiratory phasicity and response to augmentation.

Popliteal Vein: No evidence of thrombus. Normal compressibility,
respiratory phasicity and response to augmentation.

Calf Veins: No evidence of thrombus. Normal compressibility and flow
on color Doppler imaging.

Superficial Great Saphenous Vein: No evidence of thrombus. Normal
compressibility.

Venous Reflux:  None.

Other Findings:  None.

LEFT LOWER EXTREMITY

Common Femoral Vein: No evidence of thrombus. Normal
compressibility, respiratory phasicity and response to augmentation.

Saphenofemoral Junction: No evidence of thrombus. Normal
compressibility and flow on color Doppler imaging.

Profunda Femoral Vein: No evidence of thrombus. Normal
compressibility and flow on color Doppler imaging.

Femoral Vein: No evidence of thrombus. Normal compressibility,
respiratory phasicity and response to augmentation.

Popliteal Vein: No evidence of thrombus. Normal compressibility,
respiratory phasicity and response to augmentation.

Calf Veins: No evidence of thrombus. Normal compressibility and flow
on color Doppler imaging.

Superficial Great Saphenous Vein: No evidence of thrombus. Normal
compressibility.

Venous Reflux:  None.

Other Findings:  None.
IMPRESSION: No evidence of deep venous thrombosis.

## 2020-11-25 DIAGNOSIS — J4541 Moderate persistent asthma with (acute) exacerbation: Secondary | ICD-10-CM | POA: Diagnosis not present

## 2020-11-25 DIAGNOSIS — Z79899 Other long term (current) drug therapy: Secondary | ICD-10-CM | POA: Diagnosis not present

## 2020-11-25 DIAGNOSIS — L209 Atopic dermatitis, unspecified: Secondary | ICD-10-CM | POA: Diagnosis not present

## 2020-11-25 DIAGNOSIS — K2100 Gastro-esophageal reflux disease with esophagitis, without bleeding: Secondary | ICD-10-CM | POA: Diagnosis not present

## 2020-12-29 DIAGNOSIS — Z79899 Other long term (current) drug therapy: Secondary | ICD-10-CM | POA: Diagnosis not present

## 2020-12-29 DIAGNOSIS — L209 Atopic dermatitis, unspecified: Secondary | ICD-10-CM | POA: Diagnosis not present

## 2020-12-29 DIAGNOSIS — K2100 Gastro-esophageal reflux disease with esophagitis, without bleeding: Secondary | ICD-10-CM | POA: Diagnosis not present

## 2020-12-29 DIAGNOSIS — J4541 Moderate persistent asthma with (acute) exacerbation: Secondary | ICD-10-CM | POA: Diagnosis not present

## 2021-01-15 DIAGNOSIS — K2100 Gastro-esophageal reflux disease with esophagitis, without bleeding: Secondary | ICD-10-CM | POA: Diagnosis not present

## 2021-01-15 DIAGNOSIS — J4541 Moderate persistent asthma with (acute) exacerbation: Secondary | ICD-10-CM | POA: Diagnosis not present

## 2021-01-15 DIAGNOSIS — L209 Atopic dermatitis, unspecified: Secondary | ICD-10-CM | POA: Diagnosis not present

## 2021-01-15 DIAGNOSIS — Z79899 Other long term (current) drug therapy: Secondary | ICD-10-CM | POA: Diagnosis not present

## 2021-02-04 DIAGNOSIS — M5431 Sciatica, right side: Secondary | ICD-10-CM | POA: Diagnosis not present

## 2021-02-04 DIAGNOSIS — M9902 Segmental and somatic dysfunction of thoracic region: Secondary | ICD-10-CM | POA: Diagnosis not present

## 2021-02-04 DIAGNOSIS — M5386 Other specified dorsopathies, lumbar region: Secondary | ICD-10-CM | POA: Diagnosis not present

## 2021-02-04 DIAGNOSIS — M9903 Segmental and somatic dysfunction of lumbar region: Secondary | ICD-10-CM | POA: Diagnosis not present

## 2021-02-09 DIAGNOSIS — M9903 Segmental and somatic dysfunction of lumbar region: Secondary | ICD-10-CM | POA: Diagnosis not present

## 2021-02-09 DIAGNOSIS — M5386 Other specified dorsopathies, lumbar region: Secondary | ICD-10-CM | POA: Diagnosis not present

## 2021-02-09 DIAGNOSIS — M9902 Segmental and somatic dysfunction of thoracic region: Secondary | ICD-10-CM | POA: Diagnosis not present

## 2021-02-09 DIAGNOSIS — M5431 Sciatica, right side: Secondary | ICD-10-CM | POA: Diagnosis not present

## 2021-02-10 DIAGNOSIS — J309 Allergic rhinitis, unspecified: Secondary | ICD-10-CM | POA: Diagnosis not present

## 2021-02-10 DIAGNOSIS — J4541 Moderate persistent asthma with (acute) exacerbation: Secondary | ICD-10-CM | POA: Diagnosis not present

## 2021-02-10 DIAGNOSIS — L209 Atopic dermatitis, unspecified: Secondary | ICD-10-CM | POA: Diagnosis not present

## 2021-02-10 DIAGNOSIS — Z79899 Other long term (current) drug therapy: Secondary | ICD-10-CM | POA: Diagnosis not present

## 2021-02-16 DIAGNOSIS — M9903 Segmental and somatic dysfunction of lumbar region: Secondary | ICD-10-CM | POA: Diagnosis not present

## 2021-02-16 DIAGNOSIS — M5386 Other specified dorsopathies, lumbar region: Secondary | ICD-10-CM | POA: Diagnosis not present

## 2021-02-16 DIAGNOSIS — M5431 Sciatica, right side: Secondary | ICD-10-CM | POA: Diagnosis not present

## 2021-02-16 DIAGNOSIS — M9902 Segmental and somatic dysfunction of thoracic region: Secondary | ICD-10-CM | POA: Diagnosis not present

## 2021-03-17 DIAGNOSIS — L209 Atopic dermatitis, unspecified: Secondary | ICD-10-CM | POA: Diagnosis not present

## 2021-03-17 DIAGNOSIS — J4541 Moderate persistent asthma with (acute) exacerbation: Secondary | ICD-10-CM | POA: Diagnosis not present

## 2021-03-17 DIAGNOSIS — K2100 Gastro-esophageal reflux disease with esophagitis, without bleeding: Secondary | ICD-10-CM | POA: Diagnosis not present

## 2021-03-17 DIAGNOSIS — Z79899 Other long term (current) drug therapy: Secondary | ICD-10-CM | POA: Diagnosis not present

## 2021-04-14 DIAGNOSIS — J45901 Unspecified asthma with (acute) exacerbation: Secondary | ICD-10-CM | POA: Diagnosis not present

## 2021-04-30 ENCOUNTER — Encounter: Payer: Self-pay | Admitting: Allergy

## 2021-04-30 ENCOUNTER — Ambulatory Visit: Payer: Federal, State, Local not specified - PPO | Admitting: Allergy

## 2021-04-30 ENCOUNTER — Other Ambulatory Visit: Payer: Self-pay

## 2021-04-30 VITALS — BP 130/82 | HR 74 | Temp 98.7°F | Resp 16 | Ht 65.0 in | Wt 196.0 lb

## 2021-04-30 DIAGNOSIS — L2089 Other atopic dermatitis: Secondary | ICD-10-CM

## 2021-04-30 DIAGNOSIS — T7800XD Anaphylactic reaction due to unspecified food, subsequent encounter: Secondary | ICD-10-CM

## 2021-04-30 DIAGNOSIS — J310 Chronic rhinitis: Secondary | ICD-10-CM

## 2021-04-30 DIAGNOSIS — J45901 Unspecified asthma with (acute) exacerbation: Secondary | ICD-10-CM

## 2021-04-30 MED ORDER — MOMETASONE FURO-FORMOTEROL FUM 200-5 MCG/ACT IN AERO: 2.0000 | INHALATION_SPRAY | Freq: Two times a day (BID) | RESPIRATORY_TRACT | 2 refills | Status: DC

## 2021-04-30 MED ORDER — SPIRIVA RESPIMAT 1.25 MCG/ACT IN AERS: 2.0000 | INHALATION_SPRAY | Freq: Every day | RESPIRATORY_TRACT | 2 refills | Status: DC

## 2021-04-30 MED ORDER — BUDESONIDE 0.5 MG/2ML IN SUSP: 0.5000 mg | Freq: Two times a day (BID) | RESPIRATORY_TRACT | 2 refills | Status: DC

## 2021-04-30 MED ORDER — OMEPRAZOLE 20 MG PO CPDR: 20.0000 mg | DELAYED_RELEASE_CAPSULE | Freq: Every day | ORAL | 1 refills | Status: AC

## 2021-04-30 MED ORDER — TRIAMCINOLONE ACETONIDE 0.1 % EX OINT: 1.0000 "application " | TOPICAL_OINTMENT | Freq: Every day | CUTANEOUS | 3 refills | Status: AC

## 2021-04-30 MED ORDER — EPINEPHRINE 0.3 MG/0.3ML IJ SOAJ: 0.3000 mg | INTRAMUSCULAR | 2 refills | Status: DC | PRN

## 2021-04-30 NOTE — Progress Notes (Signed)
New Patient Note  RE: Barbara Espinoza MRN: [id number] DOB: 11/09/1970 Date of Office Visit: 04/30/2021  Consult requested by: Vincente Liberty, MD Primary care provider: Vincente Liberty, MD  Chief Complaint: Asthma, Eczema, and Food Intolerance  History of Present Illness: I had the pleasure of seeing Barbara Espinoza for initial evaluation at the [facility] on 05/01/2021. She is a 50 y.o. female, who is referred here by Vincente Liberty, MD for the evaluation of asthma.  Asthma:  She reports symptoms of chest tightness, shortness of breath, coughing, wheezing, nocturnal awakenings for 20+ years. Current medications include albuterol prn and albuterol/duoneb nebulizer which help. She reports not using aerochamber with inhalers. She tried the following inhalers: Advair, Wixela, Symbicort - not on a daily maintenance inhaler for awhile. She had issues with oral thrush. Main triggers are weather changes, dust, exertion. In the last month, frequency of symptoms: few times per week. Frequency of nocturnal symptoms: sometimes. Frequency of SABA use: every few hours for the past week.  When asthma is controlled uses albuterol 1-2 times per month.  Interference with physical activity: yes. Sleep is disturbed. In the last 12 months, emergency room visits/urgent care visits/doctor office visits or hospitalizations due to respiratory issues: 9. In the last 12 months, oral steroids courses: 5 - just finished a taper. Lifetime history of hospitalization for respiratory issues: no. Prior intubations: no. History of pneumonia: at age 82. She was evaluated by allergist/pulmonologist in the past. Smoking exposure: 3-4 cigs per day x 12 yrs. Up to date with flu vaccine: no. Up to date with pneumonia vaccine: no. Up to date with COVID-19 vaccine: yes. Prior Covid-19 infection: no. History of reflux: yes when taking oral prednisone.  Assessment and Plan: [name] is a 50 y.o. female  with: Not well controlled asthma with acute exacerbation Diagnosed with asthma over 20+ years ago. Not on any maintenance inhalers - some caused thrush in the past. Tried Advair, Wixela and Symbicort. Using albuterol multiples times per day for the past week. 5 courses of prednisone this year. Current smoker. Was on Dupixent for eczema but stopped due to some side effects.  Wheezing on exam. 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 Clinically feeling improved. Discussed smoking cessation. Start prednisone taper.  May take omeprazole 20mg  daily while you take the prednisone to help with the reflux. Daily controller medication(s): START Dulera 238mcg 2 puffs twice a day with spacer and rinse mouth afterwards. If not covered let us know.  START Spiriva 1.3mcg 2 puffs once a day. Sample given. Demonstrated proper use. During upper respiratory infections/asthma flares:  Start budesonide 0.5mg  nebulizer for 1-2 weeks until your breathing symptoms return to baseline.  Pretreat with albuterol 2 puffs or albuterol nebulizer.  If you need to use your albuterol nebulizer machine back to back within 15-30 minutes with no relief then please go to the ER/urgent care for further evaluation.  May use albuterol rescue inhaler 2 puffs every 4 to 6 hours as needed for shortness of breath, chest tightness, coughing, and wheezing. May use albuterol rescue inhaler 2 puffs 5 to 15 minutes prior to strenuous physical activities. Monitor frequency of use. Get spirometry at next visit. Consider biologics next if symptoms not controlled with above regimen.   Chronic rhinitis Rhino conjunctivitis symptoms in the spring and fall. Skin testing 20 years ago showed multiple positives per patient report. No prior AIT.  Unable to skin  test today due to poor respiratory status. Use over the counter antihistamines such as Zyrtec  (cetirizine), Claritin (loratadine), Allegra (fexofenadine), or Xyzal (levocetirizine) daily as needed. May take twice a day during allergy flares. May switch antihistamines every few months. Use Flonase (fluticasone) nasal spray 1 spray per nostril twice a day as needed for nasal congestion.  Nasal saline spray (i.e., Simply Saline) or nasal saline lavage (i.e., NeilMed) is recommended as needed and prior to medicated nasal sprays.  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 Avoiding shellfish and tree nuts due to positive testing in the past. Tolerates shrimp.  Continue strict avoidance of peanuts, tree nuts, shellfish. Okay to eat shrimp as before.  I have prescribed epinephrine injectable device and demonstrated proper use. For mild symptoms you can take over the counter antihistamines such as Benadryl and monitor symptoms closely. If symptoms worsen or if you have severe symptoms including breathing issues, throat closure, significant swelling, whole body hives, severe diarrhea and vomiting, lightheadedness then inject epinephrine and seek immediate medical care afterwards. Emergency action plan given. Will skin test at next visit.  Other atopic dermatitis Saw dermatology and was on [place] in the past. Stopped as it caused her skin to feel hypersensitive. See below for proper skin care.  Moisturizer: Triamcinolone-Eucerin twice a day. For more than twice a day use the following: Aquaphor, Vaseline, Cerave, Cetaphil, Eucerin, Vanicream.   Return in about 2 months (around 07/01/2021) for Skin testing.  Recommended 4 week follow up but patient is going out of town.   Meds ordered this encounter  Medications   mometasone-formoterol (DULERA) 200-5 MCG/ACT AERO    Sig: Inhale 2 puffs into the lungs in the morning and at bedtime. with spacer and rinse mouth afterwards.    Dispense:  1 each    Refill:  2   budesonide (PULMICORT) 0.5 MG/2ML nebulizer  solution    Sig: Take 2 mLs (0.5 mg total) by nebulization in the morning and at bedtime.    Dispense:  120 mL    Refill:  2   omeprazole (PRILOSEC) 20 MG capsule    Sig: Take 1 capsule (20 mg total) by mouth daily.    Dispense:  30 capsule    Refill:  1   Tiotropium Bromide Monohydrate (SPIRIVA RESPIMAT) 1.25 MCG/ACT AERS    Sig: Inhale 2 puffs into the lungs daily.    Dispense:  4 g    Refill:  2   EPINEPHrine 0.3 mg/0.3 mL IJ SOAJ injection    Sig: Inject 0.3 mg into the muscle as needed for anaphylaxis.    Dispense:  1 each    Refill:  2    May dispense generic/Mylan/Teva brand.   triamcinolone ointment (KENALOG) 0.1 %    Sig: Apply 1 application topically daily. Use as a moisturizer.    Dispense:  453 g    Refill:  3    Mix 1:1 with Eucerin.    Lab Orders  No laboratory test(s) ordered today    Other allergy screening: Rhino conjunctivitis: yes Nasal congestion, itchy/watery eyes, sneezing for many years. Usually flares in the spring and fall. Takes OTC antihistamines, Flonase with some benefit. 20 years ago had skin testing done which showed multiple positives. No prior AIT.   Food allergy: yes Peanuts caused wheezing and hives in the past. Avoiding shellfish due to positive testing in the past. She tolerates shrimp with no issues though.   Currently avoiding peanuts, tree nuts  and shellfish besides shrimp.  Dietary History: patient has been eating other foods including milk, eggs, sesame, shellfish, fish, soy, wheat, meats, fruits and vegetables.  Medication allergy: no Hymenoptera allergy: no Urticaria: no Eczema:yes This can occur anywhere on her body and got worse around 2018. Currently using triamcinolone cream with some benefit. Patient was seen by dermatology and was on dupixent every 2 weeks but it made her feel like she had sunburns and her skin felt very sensitive to touch.   History of recurrent infections suggestive of immunodeficency:  no  Diagnostics: Spirometry:  Tracings reviewed. Her effort: Good reproducible efforts. FVC: 1.55L FEV1: 0.96L, 40% predicted FEV1/FVC ratio: 62% Interpretation: Spirometry consistent with mixed obstructive and restrictive disease with no improvement in FEV1 post bronchodilator treatment. Clinically feeling improved.   Please see scanned spirometry results for details.  Skin Testing:  deferred due to active wheezing .  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   NO ESTROGEN PRODUCTS never confirmed   Seasonal allergies    occ wheezing due to   Past Surgical History: Past Surgical History:  Procedure Laterality Date   colonscopy  09/2017   DILATATION & CURETTAGE/HYSTEROSCOPY WITH MYOSURE N/A 10/30/2018   Procedure: [place];  Surgeon: Anastasio Auerbach, MD;  Location: [facility];  Service: Gynecology;  Laterality: N/A;  request to follow 1st case in [place] block  requests one hour   NO PAST SURGERIES     Medication List:  Current Outpatient Medications  Medication Sig Dispense Refill   albuterol (PROVENTIL HFA;VENTOLIN HFA) 108 (90 BASE) MCG/ACT inhaler Inhale 2 puffs into the lungs every 4 (four) hours as needed. Shortness of breath and wheezing      albuterol (PROVENTIL) (2.5 MG/3ML) 0.083% nebulizer solution Take 2.5 mg by nebulization every 6 (six) hours as needed. Shortness of breath and wheezing      budesonide (PULMICORT) 0.5 MG/2ML nebulizer solution Take 2 mLs (0.5  mg total) by nebulization in the morning and at bedtime. 120 mL 2   EPINEPHrine 0.3 mg/0.3 mL IJ SOAJ injection Inject 0.3 mg into the muscle as needed for anaphylaxis. 1 each 2   escitalopram (LEXAPRO) 5 MG tablet Take 1 tablet by mouth daily.     mometasone-formoterol (DULERA) 200-5 MCG/ACT AERO Inhale 2 puffs into the lungs in the morning and at bedtime. with spacer and rinse mouth afterwards. 1 each 2   Multiple Vitamins-Minerals (HAIR SKIN AND NAILS FORMULA) TABS Take by mouth.     omeprazole (PRILOSEC) 20 MG capsule Take 1 capsule (20 mg total) by mouth daily. 30 capsule 1   Tiotropium Bromide Monohydrate (SPIRIVA RESPIMAT) 1.25 MCG/ACT AERS Inhale 2 puffs into the lungs daily. 4 g 2   triamcinolone ointment (KENALOG) 0.1 % Apply 1 application topically daily. Use as a moisturizer. 453 g 3   No current facility-administered medications for this visit.   Allergies: Allergies  Allergen Reactions   Peanut-Containing Drug Products Hives    "  NUTS" Reaction: wheezing   Shellfish Allergy Hives    Reaction: wheezing   Social History: Social History   Socioeconomic History   Marital status: Single    Spouse name: Not on file   Number of children: Not on file   Years of education: Not on file   Highest education level: Not on file  Occupational History   Not on file  Tobacco Use   Smoking status: Every Day    Types: Cigars   Smokeless tobacco: Never  Vaping Use   Vaping Use: Never used  Substance and Sexual Activity   Alcohol use: Yes    Comment: q month   Drug use: Yes    Types: Marijuana    Comment: last marijuana use last week   Sexual activity: Yes    Birth control/protection: None    Comment: intercourse age 2, declined 2nd insuracne questions  Other Topics Concern   Not on file  Social History Narrative   Not on file   Social Determinants of Health   Financial Resource Strain: Not on file  Food Insecurity: Not on file  Transportation Needs: Not on file   Physical Activity: Not on file  Stress: Not on file  Social Connections: Not on file   Lives in a [address] year old townhome. Smoking: 3-4 cigarettes per day Occupation: Research scientist (physical sciences) History: Water Damage/mildew in the house: yes Carpet in the family room: yes Carpet in the bedroom: yes Heating: electric Cooling: central Pet: yes 1 dog  x 7.5 yrs  Family History: Family History  Problem Relation Age of Onset   Cancer Mother        COLON CANCER--MOM W MULTIPLE MYELOMA/HTN 2010-LIVES IN [place]   Hypertension Mother    Multiple myeloma Mother    Cancer Father        stomach cancer   Problem                               Relation Asthma                                   no Eczema                                no Food allergy                          no Allergic rhino conjunctivitis     mother   Review of Systems  Constitutional:  Negative for appetite change, chills, fever and unexpected weight change.  HENT:  Negative for congestion and rhinorrhea.   Eyes:  Positive for itching.  Respiratory:  Positive for cough, chest tightness, shortness of breath and wheezing.   Cardiovascular:  Negative for chest pain.  Gastrointestinal:  Negative for abdominal pain.  Genitourinary:  Negative for difficulty urinating.  Skin:  Positive for rash.  Neurological:  Negative for headaches.   Objective: BP 130/82   Pulse 74   Temp 98.7 F (37.1 C) (Temporal)   Resp 16   Ht $R'5\' 5"'ac$  (1.651 m)   Wt 196 lb (88.9 kg)   SpO2 96%   BMI 32.62 kg/m  Body mass index is 32.62 kg/m. Physical Exam Vitals and nursing note reviewed.  Constitutional:  Appearance: Normal appearance. She is well-developed.  HENT:     Head: Normocephalic and atraumatic.     Right Ear: Tympanic membrane and external ear normal.     Left Ear: Tympanic membrane and external ear normal.     Nose: Nose normal.     Mouth/Throat:     Mouth: Mucous membranes are moist.     Pharynx:  Oropharynx is clear.  Eyes:     Conjunctiva/sclera: Conjunctivae normal.  Cardiovascular:     Rate and Rhythm: Normal rate and regular rhythm.     Heart sounds: Normal heart sounds. No murmur heard.   No friction rub. No gallop.  Pulmonary:     Effort: Pulmonary effort is normal.     Breath sounds: Wheezing and rhonchi present. No rales.  Musculoskeletal:     Cervical back: Neck supple.  Skin:    General: Skin is warm and dry.     Findings: Rash present.  Neurological:     Mental Status: She is alert and oriented to person, place, and time.  Psychiatric:        Behavior: Behavior normal.  The plan was reviewed with the patient/family, and all questions/concerned were addressed.  It was my pleasure to see Arna today and participate in her care. Please feel free to contact me with any questions or concerns.  Sincerely,  Rexene Alberts, DO Allergy & Immunology  [facility] office: [place] office: [phone]

## 2021-04-30 NOTE — Patient Instructions (Addendum)
Asthma:  Decrease smoking.  Prednisone 10mg  tablet pack: 2 tablets given in office today. Take 2 more tablets before bed today.  Then take 2 tablets twice a day for 2 more days. Then take 2 tablets once a day for 1 day. Then take 1 tablet once a day for 1 day.   May take omeprazole 20mg  daily while you take the prednisone to help with the reflux.  Daily controller medication(s): START Dulera 293mcg 2 puffs twice a day with spacer and rinse mouth afterwards. If not covered let us know.  START Spiriva 1.9mcg 2 puffs once a day. Sample given. Demonstrated proper use. During upper respiratory infections/asthma flares:  Start budesonide 0.5mg  nebulizer for 1-2 weeks until your breathing symptoms return to baseline.  Pretreat with albuterol 2 puffs or albuterol nebulizer.  If you need to use your albuterol nebulizer machine back to back within 15-30 minutes with no relief then please go to the ER/urgent care for further evaluation.  May use albuterol rescue inhaler 2 puffs every 4 to 6 hours as needed for shortness of breath, chest tightness, coughing, and wheezing. May use albuterol rescue inhaler 2 puffs 5 to 15 minutes prior to strenuous physical activities. Monitor frequency of use.  Asthma control goals:  Full participation in all desired activities (may need albuterol before activity) Albuterol use two times or less a week on average (not counting use with activity) Cough interfering with sleep two times or less a month Oral steroids no more than once a year No hospitalizations   Rhinitis:  Use over the counter antihistamines such as Zyrtec (cetirizine), Claritin (loratadine), Allegra (fexofenadine), or Xyzal (levocetirizine) daily as needed. May take twice a day during allergy flares. May switch antihistamines every few months. Use Flonase (fluticasone) nasal spray 1 spray per nostril twice a day as needed for nasal congestion.  Nasal saline spray (i.e., Simply Saline) or nasal saline  lavage (i.e., NeilMed) is recommended as needed and prior to medicated nasal sprays.  Food allergies Continue strict avoidance of peanuts, tree nuts, shellfish. I have prescribed epinephrine injectable device and demonstrated proper use. For mild symptoms you can take over the counter antihistamines such as Benadryl and monitor symptoms closely. If symptoms worsen or if you have severe symptoms including breathing issues, throat closure, significant swelling, whole body hives, severe diarrhea and vomiting, lightheadedness then inject epinephrine and seek immediate medical care afterwards. Emergency action plan given.  Eczema: See below for proper skin care.  Moisturizer: Triamcinolone-Eucerin twice a day. For more than twice a day use the following: Aquaphor, Vaseline, Cerave, Cetaphil, Eucerin, Vanicream.   Follow up in 2 months or sooner if needed No antihistamines for 3 days before.    Skin care recommendations  Bath time: Always use lukewarm water. AVOID very hot or cold water. Keep bathing time to 5-10 minutes. Do NOT use bubble bath. Use a mild soap and use just enough to wash the dirty areas. Do NOT scrub skin vigorously.  After bathing, pat dry your skin with a towel. Do NOT rub or scrub the skin.  Moisturizers and prescriptions:  ALWAYS apply moisturizers immediately after bathing (within 3 minutes). This helps to lock-in moisture. Use the moisturizer several times a day over the whole body. Good summer moisturizers include: Aveeno, CeraVe, Cetaphil. Good winter moisturizers include: Aquaphor, Vaseline, Cerave, Cetaphil, Eucerin, Vanicream. When using moisturizers along with medications, the moisturizer should be applied about one hour after applying the medication to prevent diluting effect of the medication or moisturize  around where you applied the medications. When not using medications, the moisturizer can be continued twice daily as maintenance.  Laundry and  clothing: Avoid laundry products with added color or perfumes. Use unscented hypo-allergenic laundry products such as Tide free, Cheer free & gentle, and All free and clear.  If the skin still seems dry or sensitive, you can try double-rinsing the clothes. Avoid tight or scratchy clothing such as wool. Do not use fabric softeners or dyer sheets.

## 2021-05-01 DIAGNOSIS — J310 Chronic rhinitis: Secondary | ICD-10-CM | POA: Insufficient documentation

## 2021-05-01 DIAGNOSIS — T7800XA Anaphylactic reaction due to unspecified food, initial encounter: Secondary | ICD-10-CM | POA: Insufficient documentation

## 2021-05-01 DIAGNOSIS — L2089 Other atopic dermatitis: Secondary | ICD-10-CM | POA: Insufficient documentation

## 2021-05-01 DIAGNOSIS — T7800XD Anaphylactic reaction due to unspecified food, subsequent encounter: Secondary | ICD-10-CM | POA: Insufficient documentation

## 2021-05-01 NOTE — Assessment & Plan Note (Signed)
Peanuts caused hives and wheezing. Avoiding shellfish and tree nuts due to positive testing in the past. Tolerates shrimp.   Continue strict avoidance of peanuts, tree nuts, shellfish.  Okay to eat shrimp as before.   I have prescribed epinephrine injectable device and demonstrated proper use. For mild symptoms you can take over the counter antihistamines such as Benadryl and monitor symptoms closely. If symptoms worsen or if you have severe symptoms including breathing issues, throat closure, significant swelling, whole body hives, severe diarrhea and vomiting, lightheadedness then inject epinephrine and seek immediate medical care afterwards.  Emergency action plan given.  Will skin test at next visit.

## 2021-05-01 NOTE — Assessment & Plan Note (Signed)
Saw dermatology and was on [place] in the past. Stopped as it caused her skin to feel hypersensitive. . See below for proper skin care.  . Moisturizer: Triamcinolone-Eucerin twice a day. . For more than twice a day use the following: Aquaphor, Vaseline, Cerave, Cetaphil, Eucerin, Vanicream.

## 2021-05-01 NOTE — Assessment & Plan Note (Signed)
Rhino conjunctivitis symptoms in the spring and fall. Skin testing 20 years ago showed multiple positives per patient report. No prior AIT.   Unable to skin test today due to poor respiratory status.  Use over the counter antihistamines such as Zyrtec (cetirizine), Claritin (loratadine), Allegra (fexofenadine), or Xyzal (levocetirizine) daily as needed. May take twice a day during allergy flares. May switch antihistamines every few months. . Use Flonase (fluticasone) nasal spray 1 spray per nostril twice a day as needed for nasal congestion.   Nasal saline spray (i.e., Simply Saline) or nasal saline lavage (i.e., NeilMed) is recommended as needed and prior to medicated nasal sprays.

## 2021-05-01 NOTE — Assessment & Plan Note (Signed)
Diagnosed with asthma over 20+ years ago. Not on any maintenance inhalers - some caused thrush in the past. Tried Advair, Wixela and Symbicort. Using albuterol multiples times per day for the past week. 5 courses of prednisone this year. Current smoker. Was on Dupixent for eczema but stopped due to some side effects.   Wheezing on exam.  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 Clinically feeling improved.  Discussed smoking cessation.  Start prednisone taper.   May take omeprazole 20mg  daily while you take the prednisone to help with the reflux. . Daily controller medication(s): START Dulera 280mcg 2 puffs twice a day with spacer and rinse mouth afterwards. o If not covered let us know.  o START Spiriva 1.49mcg 2 puffs once a day. Sample given. Demonstrated proper use. . During upper respiratory infections/asthma flares:  o Start budesonide 0.5mg  nebulizer for 1-2 weeks until your breathing symptoms return to baseline.  o Pretreat with albuterol 2 puffs or albuterol nebulizer.  o If you need to use your albuterol nebulizer machine back to back within 15-30 minutes with no relief then please go to the ER/urgent care for further evaluation.  . May use albuterol rescue inhaler 2 puffs every 4 to 6 hours as needed for shortness of breath, chest tightness, coughing, and wheezing. May use albuterol rescue inhaler 2 puffs 5 to 15 minutes prior to strenuous physical activities. Monitor frequency of use. . Get spirometry at next visit. . Consider biologics next if symptoms not controlled with above regimen.

## 2021-05-06 DIAGNOSIS — J45998 Other asthma: Secondary | ICD-10-CM | POA: Diagnosis not present

## 2021-05-06 DIAGNOSIS — Z800 Family history of malignant neoplasm of digestive organs: Secondary | ICD-10-CM | POA: Diagnosis not present

## 2021-05-06 DIAGNOSIS — Z1211 Encounter for screening for malignant neoplasm of colon: Secondary | ICD-10-CM | POA: Diagnosis not present

## 2021-05-19 DIAGNOSIS — J45901 Unspecified asthma with (acute) exacerbation: Secondary | ICD-10-CM | POA: Diagnosis not present

## 2021-06-23 DIAGNOSIS — J4540 Moderate persistent asthma, uncomplicated: Secondary | ICD-10-CM | POA: Diagnosis not present

## 2021-06-23 DIAGNOSIS — L209 Atopic dermatitis, unspecified: Secondary | ICD-10-CM | POA: Diagnosis not present

## 2021-06-23 DIAGNOSIS — Z79899 Other long term (current) drug therapy: Secondary | ICD-10-CM | POA: Diagnosis not present

## 2021-06-23 DIAGNOSIS — K2100 Gastro-esophageal reflux disease with esophagitis, without bleeding: Secondary | ICD-10-CM | POA: Diagnosis not present

## 2021-06-25 ENCOUNTER — Other Ambulatory Visit (HOSPITAL_BASED_OUTPATIENT_CLINIC_OR_DEPARTMENT_OTHER): Payer: Self-pay | Admitting: Pulmonary Disease

## 2021-06-25 ENCOUNTER — Ambulatory Visit (HOSPITAL_BASED_OUTPATIENT_CLINIC_OR_DEPARTMENT_OTHER)
Admission: RE | Admit: 2021-06-25 | Discharge: 2021-06-25 | Disposition: A | Discharge status: Home / Self Care | Payer: Federal, State, Local not specified - PPO | Source: Ambulatory Visit | Attending: Pulmonary Disease | Admitting: Pulmonary Disease

## 2021-06-25 ENCOUNTER — Other Ambulatory Visit: Payer: Self-pay

## 2021-06-25 DIAGNOSIS — J4540 Moderate persistent asthma, uncomplicated: Secondary | ICD-10-CM

## 2021-06-25 DIAGNOSIS — J479 Bronchiectasis, uncomplicated: Secondary | ICD-10-CM | POA: Diagnosis not present

## 2021-06-25 DIAGNOSIS — J45909 Unspecified asthma, uncomplicated: Secondary | ICD-10-CM | POA: Diagnosis not present

## 2021-07-01 NOTE — Progress Notes (Signed)
Follow Up Note  RE: Barbara Espinoza MRN: [id number] DOB: March 02, 1971 Date of Office Visit: 07/02/2021  Referring provider: Vincente Liberty, MD Primary care provider: Vincente Liberty, MD  Chief Complaint: Allergy Testing  History of Present Illness: I had the pleasure of seeing Barbara Espinoza for a follow up visit at the [facility] on 07/02/2021. She is a 51 y.o. female, who is being followed for asthma, chronic rhinitis, food allergy and atopic dermatitis. Her previous allergy office visit was on 04/30/2021 with Dr. Maudie Mercury. Today is a skin testing and follow up visit.  Asthma Currently on Dulera 259mcg 2 puffs BID and Spiriva 1.56mcg 2 puffs once a day. About 10 days ago went to see PCP due to shortness of breath, wheezing, coughing. Currently on decadron pills.  No fevers/chills.  Used albuterol and budesonide nebulizer with some benefit.  Still smoking about 1-2 cigs/day.  Taking omeprazole 20mg  daily when on steroids with good benefit.   Patient was still using albuterol 2 puffs every 4-6 hours with minimal benefit.   Denies any cardiac issues.   ACT score 9.  Chronic rhinitis Currently not taking any medications.    Anaphylactic shock due to adverse food reaction Avoiding peanuts, tree nuts, shellfish. No reactions since the last visit.   Other atopic dermatitis Cleared up with the decadron Using triamcinolone as needed.   06/25/2021 CXR: "The lungs are symmetrically well expanded and are clear. Minimal  central and infrahilar bronchiectasis appears stable since prior  examination. No pneumothorax or pleural effusion. Cardiac size  within normal limits. Pulmonary vascularity is normal. No acute bone  abnormality. "  Assessment and Plan: Barbara Espinoza is a 51 y.o. female with: Not well controlled asthma with acute exacerbation Past history - Diagnosed with asthma over 20+ years ago. Not on any maintenance inhalers - some caused thrush in the past.  Tried Advair, Wixela and Symbicort.  5 courses of prednisone this year. Current smoker. Was on Dupixent for eczema but stopped due to some side effects. 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 Clinically feeling improved. Interim history - Feeling unchanged despite daily Dulera and Spiriva use. Now on decadron again. 06/25/2021 CXR no acute process. Still smoking.  Today's spirometry showed: restriction - improved from last one. ACT score 9. Finish decadron.  Take omeprazole while on steroids.  Get bloodwork to see if qualify for biologics.   Daily controller medication(s): START Breztri 2 puffs twice a day with spacer and rinse mouth afterwards. Samples given.  If not covered let us know.  Stop Dulera and Spiriva.  During upper respiratory infections/asthma flares:  Start budesonide 0.5mg  nebulizer for 1-2 weeks until your breathing symptoms return to baseline.  Pretreat with albuterol 2 puffs or albuterol nebulizer.  If you need to use your albuterol nebulizer machine back to back within 15-30 minutes with no relief then please go to the ER/urgent care for further evaluation.  May use albuterol rescue inhaler 2 puffs or nebulizer every 4 to 6 hours as needed for shortness of breath, chest tightness, coughing, and wheezing. May use albuterol rescue inhaler 2 puffs 5 to 15 minutes prior to strenuous physical activities. Monitor frequency of use. Get spirometry at next visit.   Bronchiectasis without complication ([place]) 01/29/9891 showed minimal central and infrahilar bronchiectasis appears stable sine prior examination. Denies any pneumonias, bronchitis or frequent infections.  Get bloodwork as below. Keep track of infections/antibiotics use.  Chronic rhinitis Past history - Rhino conjunctivitis symptoms in the spring and fall. Skin testing 20 years ago showed multiple positives per patient report. No prior  AIT.  Interim history - stable with no meds. Usually flares with the pollen.  Unable to skin test today due to poor asthma status.  Will get bloodwork as already getting for other reasons.  Use over the counter antihistamines such as Zyrtec (cetirizine), Claritin (loratadine), Allegra (fexofenadine), or Xyzal (levocetirizine) daily as needed. May take twice a day during allergy flares. May switch antihistamines every few months. Start Ryaltris (olopatadine + mometasone nasal spray combination) 1-2 sprays per nostril twice a day. Sample given. Nasal saline spray (i.e., Simply Saline) or nasal saline lavage (i.e., NeilMed) is recommended as needed and prior to medicated nasal sprays.  Anaphylactic reaction due to food, subsequent encounter Past history - Peanuts caused hives and wheezing. Avoiding shellfish and tree nuts due to positive testing in the past. Tolerates shrimp.  Interim history - no reactions Continue strict avoidance of peanuts, tree nuts, shellfish. Get bloodwork.  For mild symptoms you can take over the counter antihistamines such as Benadryl and monitor symptoms closely. If symptoms worsen or if you have severe symptoms including breathing issues, throat closure, significant swelling, whole body hives, severe diarrhea and vomiting, lightheadedness then inject epinephrine and seek immediate medical care afterwards. Emergency action plan in place.   Other atopic dermatitis Past history - Saw dermatology and was on [place] in the past. Stopped as it caused her skin to feel hypersensitive. Interim history - cleared up since on decadron.  Continue proper skin care.  Moisturizer: Triamcinolone-Eucerin twice a day. For more than twice a day use the following: Aquaphor, Vaseline, Cerave, Cetaphil, Eucerin, Vanicream  Return in about 4 weeks (around 07/30/2021).  Meds ordered this encounter  Medications   Budeson-Glycopyrrol-Formoterol (BREZTRI AEROSPHERE) 160-9-4.8 MCG/ACT AERO     Sig: Inhale 2 puffs into the lungs in the morning and at bedtime. with spacer and rinse mouth afterwards.    Dispense:  10.7 g    Refill:  3   Lab Orders         CBC with Differential/Platelet         Complement, total         Allergens w/Total IgE Area 2         Alpha-1-antitrypsin         ANA w/Reflex         Diphtheria / Tetanus Antibody Panel         Strep pneumoniae 23 Serotypes IgG         IgG, IgA, IgM         IgE Nut Prof. w/Component Rflx         Allergen Profile, Shellfish      Diagnostics: Spirometry:  Tracings reviewed. Her effort: Good reproducible efforts. FVC: 2.35L FEV1: 1.64L, 69% predicted FEV1/FVC ratio: 70% Interpretation: Spirometry consistent with possible restrictive disease.  Please see scanned spirometry results for details.  Medication List:  Current Outpatient Medications  Medication Sig Dispense Refill   albuterol (PROVENTIL HFA;VENTOLIN HFA) 108 (90 BASE) MCG/ACT inhaler Inhale 2 puffs into the lungs every 4 (four) hours as needed. Shortness of breath and wheezing      albuterol (PROVENTIL) (2.5 MG/3ML) 0.083% nebulizer solution Take 2.5 mg by nebulization every 6 (six) hours as needed. Shortness of breath and wheezing      Budeson-Glycopyrrol-Formoterol (BREZTRI AEROSPHERE) 160-9-4.8 MCG/ACT AERO Inhale 2 puffs into the lungs in the  morning and at bedtime. with spacer and rinse mouth afterwards. 10.7 g 3   budesonide (PULMICORT) 0.5 MG/2ML nebulizer solution Take 2 mLs (0.5 mg total) by nebulization in the morning and at bedtime. 120 mL 2   EPINEPHrine 0.3 mg/0.3 mL IJ SOAJ injection Inject 0.3 mg into the muscle as needed for anaphylaxis. 1 each 2   escitalopram (LEXAPRO) 5 MG tablet Take 1 tablet by mouth daily.     Multiple Vitamins-Minerals (HAIR SKIN AND NAILS FORMULA) TABS Take by mouth.     omeprazole (PRILOSEC) 20 MG capsule Take 1 capsule (20 mg total) by mouth daily. 30 capsule 1   triamcinolone ointment (KENALOG) 0.1 % Apply 1 application  topically daily. Use as a moisturizer. 453 g 3   dexamethasone (DECADRON) 4 MG tablet Take by mouth.     No current facility-administered medications for this visit.   Allergies: Allergies  Allergen Reactions   Peanut-Containing Drug Products Hives    "NUTS" Reaction: wheezing   Shellfish Allergy Hives    Reaction: wheezing   I reviewed her past medical history, social history, family history, and environmental history and no significant changes have been reported from her previous visit.  Review of Systems  Constitutional:  Negative for appetite change, chills, fever and unexpected weight change.  HENT:  Negative for congestion and rhinorrhea.   Respiratory:  Positive for cough, chest tightness, shortness of breath and wheezing.   Cardiovascular:  Negative for chest pain.  Gastrointestinal:  Negative for abdominal pain.  Genitourinary:  Negative for difficulty urinating.  Skin:  Positive for rash.  Neurological:  Negative for headaches.   Objective: BP 114/66 (BP Location: Right Arm, Patient Position: Sitting, Cuff Size: Normal)    Pulse (!) 59    Temp (!) 97 F (36.1 C) (Temporal)    Resp 16    SpO2 99%  There is no height or weight on file to calculate BMI. Physical Exam Vitals and nursing note reviewed.  Constitutional:      Appearance: Normal appearance. She is well-developed.  HENT:     Head: Normocephalic and atraumatic.     Right Ear: Tympanic membrane and external ear normal.     Left Ear: Tympanic membrane and external ear normal.     Nose: Nose normal.     Mouth/Throat:     Mouth: Mucous membranes are moist.     Pharynx: Oropharynx is clear.  Eyes:     Conjunctiva/sclera: Conjunctivae normal.  Cardiovascular:     Rate and Rhythm: Normal rate and regular rhythm.     Heart sounds: Normal heart sounds. No murmur heard.   No friction rub. No gallop.  Pulmonary:     Effort: Pulmonary effort is normal.     Breath sounds: No wheezing, rhonchi or rales.      Comments: Decreased breath sounds. Musculoskeletal:     Cervical back: Neck supple.  Skin:    General: Skin is warm and dry.  Neurological:     Mental Status: She is alert and oriented to person, place, and time.  Psychiatric:        Behavior: Behavior normal.   Previous notes and tests were reviewed. The plan was reviewed with the patient/family, and all questions/concerned were addressed.  It was my pleasure to see Barbara Espinoza today and participate in her care. Please feel free to contact me with any questions or concerns.  Sincerely,  Rexene Alberts, DO Allergy & Immunology  [facility]  office: [place] office: [phone]

## 2021-07-02 ENCOUNTER — Other Ambulatory Visit: Payer: Self-pay

## 2021-07-02 ENCOUNTER — Encounter: Payer: Self-pay | Admitting: Allergy

## 2021-07-02 ENCOUNTER — Ambulatory Visit: Payer: Federal, State, Local not specified - PPO | Admitting: Allergy

## 2021-07-02 VITALS — BP 114/66 | HR 59 | Temp 97.0°F | Resp 16

## 2021-07-02 DIAGNOSIS — J310 Chronic rhinitis: Secondary | ICD-10-CM | POA: Diagnosis not present

## 2021-07-02 DIAGNOSIS — J4551 Severe persistent asthma with (acute) exacerbation: Secondary | ICD-10-CM

## 2021-07-02 DIAGNOSIS — J479 Bronchiectasis, uncomplicated: Secondary | ICD-10-CM | POA: Insufficient documentation

## 2021-07-02 DIAGNOSIS — L2089 Other atopic dermatitis: Secondary | ICD-10-CM

## 2021-07-02 DIAGNOSIS — T7800XD Anaphylactic reaction due to unspecified food, subsequent encounter: Secondary | ICD-10-CM

## 2021-07-02 MED ORDER — BREZTRI AEROSPHERE 160-9-4.8 MCG/ACT IN AERO: 2.0000 | INHALATION_SPRAY | Freq: Two times a day (BID) | RESPIRATORY_TRACT | 3 refills | Status: DC

## 2021-07-02 NOTE — Assessment & Plan Note (Signed)
Past history - Saw dermatology and was on [place] in the past. Stopped as it caused her skin to feel hypersensitive. Interim history - cleared up since on decadron.   Continue proper skin care.   Moisturizer: Triamcinolone-Eucerin twice a day.  For more than twice a day use the following: Aquaphor, Vaseline, Cerave, Cetaphil, Eucerin, Vanicream

## 2021-07-02 NOTE — Assessment & Plan Note (Signed)
Past history - Peanuts caused hives and wheezing. Avoiding shellfish and tree nuts due to positive testing in the past. Tolerates shrimp.  Interim history - no reactions  Continue strict avoidance of peanuts, tree nuts, shellfish.  Get bloodwork.   For mild symptoms you can take over the counter antihistamines such as Benadryl and monitor symptoms closely. If symptoms worsen or if you have severe symptoms including breathing issues, throat closure, significant swelling, whole body hives, severe diarrhea and vomiting, lightheadedness then inject epinephrine and seek immediate medical care afterwards.  Emergency action plan in place.

## 2021-07-02 NOTE — Assessment & Plan Note (Addendum)
Past history - Diagnosed with asthma over 20+ years ago. Not on any maintenance inhalers - some caused thrush in the past. Tried Advair, Wixela and Symbicort.  5 courses of prednisone this year. Current smoker. Was on Dupixent for eczema but stopped due to some side effects. 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 Clinically feeling improved. Interim history - Feeling unchanged despite daily Dulera and Spiriva use. Now on decadron again. 06/25/2021 CXR no acute process. Still smoking.   Today's spirometry showed: restriction - improved from last one.  ACT score 9.  Finish decadron.   Take omeprazole while on steroids.   Get bloodwork to see if qualify for biologics.    Daily controller medication(s): START Breztri 2 puffs twice a day with spacer and rinse mouth afterwards. Samples given.  o If not covered let us know.  o Stop Dulera and Spiriva.   During upper respiratory infections/asthma flares:  o Start budesonide 0.5mg  nebulizer for 1-2 weeks until your breathing symptoms return to baseline.  o Pretreat with albuterol 2 puffs or albuterol nebulizer.  o If you need to use your albuterol nebulizer machine back to back within 15-30 minutes with no relief then please go to the ER/urgent care for further evaluation.   May use albuterol rescue inhaler 2 puffs or nebulizer every 4 to 6 hours as needed for shortness of breath, chest tightness, coughing, and wheezing. May use albuterol rescue inhaler 2 puffs 5 to 15 minutes prior to strenuous physical activities. Monitor frequency of use.  Get spirometry at next visit.

## 2021-07-02 NOTE — Assessment & Plan Note (Signed)
06/25/2021 showed minimal central and infrahilar bronchiectasis appears stable sine prior examination. Denies any pneumonias, bronchitis or frequent infections.   Get bloodwork as below.  Keep track of infections/antibiotics use.

## 2021-07-02 NOTE — Assessment & Plan Note (Signed)
Past history - Rhino conjunctivitis symptoms in the spring and fall. Skin testing 20 years ago showed multiple positives per patient report. No prior AIT.  Interim history - stable with no meds. Usually flares with the pollen.   Unable to skin test today due to poor asthma status.   Will get bloodwork as already getting for other reasons.   Use over the counter antihistamines such as Zyrtec (cetirizine), Claritin (loratadine), Allegra (fexofenadine), or Xyzal (levocetirizine) daily as needed. May take twice a day during allergy flares. May switch antihistamines every few months.  Start Ryaltris (olopatadine + mometasone nasal spray combination) 1-2 sprays per nostril twice a day. Sample given.  Nasal saline spray (i.e., Simply Saline) or nasal saline lavage (i.e., NeilMed) is recommended as needed and prior to medicated nasal sprays.

## 2021-07-02 NOTE — Patient Instructions (Addendum)
Asthma:  Finish decadron.  Take omeprazole while on steroids.  Get bloodwork to see if you qualify for biologics - read handout.  We are ordering labs, so please allow 1-2 weeks for the results to come back. With the newly implemented Cures Act, the labs might be visible to you at the same time that they become visible to me. However, I will not address the results until all of the results are back, so please be patient.  In the meantime, continue recommendations in your patient instructions, including avoidance measures (if applicable), until you hear from me.  Daily controller medication(s): START Breztri 2 puffs twice a day with spacer and rinse mouth afterwards. If not covered let us know.  Stop Dulera and Spiriva.  During upper respiratory infections/asthma flares:  Start budesonide 0.5mg  nebulizer for 1-2 weeks until your breathing symptoms return to baseline.  Pretreat with albuterol 2 puffs or albuterol nebulizer.  If you need to use your albuterol nebulizer machine back to back within 15-30 minutes with no relief then please go to the ER/urgent care for further evaluation.  May use albuterol rescue inhaler 2 puffs or nebulizer every 4 to 6 hours as needed for shortness of breath, chest tightness, coughing, and wheezing. May use albuterol rescue inhaler 2 puffs 5 to 15 minutes prior to strenuous physical activities. Monitor frequency of use.  Asthma control goals:  Full participation in all desired activities (may need albuterol before activity) Albuterol use two times or less a week on average (not counting use with activity) Cough interfering with sleep two times or less a month Oral steroids no more than once a year No hospitalizations   Rhinitis:  Use over the counter antihistamines such as Zyrtec (cetirizine), Claritin (loratadine), Allegra (fexofenadine), or Xyzal (levocetirizine) daily as needed. May take twice a day during allergy flares. May switch antihistamines every few  months. Start Ryaltris (olopatadine + mometasone nasal spray combination) 1-2 sprays per nostril twice a day. Sample given.  Nasal saline spray (i.e., Simply Saline) or nasal saline lavage (i.e., NeilMed) is recommended as needed and prior to medicated nasal sprays.  Food allergies Continue strict avoidance of peanuts, tree nuts, shellfish. For mild symptoms you can take over the counter antihistamines such as Benadryl and monitor symptoms closely. If symptoms worsen or if you have severe symptoms including breathing issues, throat closure, significant swelling, whole body hives, severe diarrhea and vomiting, lightheadedness then inject epinephrine and seek immediate medical care afterwards. Emergency action plan in place.   Eczema: Continue proper skin care.  Moisturizer: Triamcinolone-Eucerin twice a day. For more than twice a day use the following: Aquaphor, Vaseline, Cerave, Cetaphil, Eucerin, Vanicream.   Follow up in 1 months or sooner if needed

## 2021-07-07 ENCOUNTER — Telehealth: Payer: Self-pay | Admitting: Allergy

## 2021-07-07 DIAGNOSIS — M5431 Sciatica, right side: Secondary | ICD-10-CM | POA: Diagnosis not present

## 2021-07-07 DIAGNOSIS — M9902 Segmental and somatic dysfunction of thoracic region: Secondary | ICD-10-CM | POA: Diagnosis not present

## 2021-07-07 DIAGNOSIS — M9903 Segmental and somatic dysfunction of lumbar region: Secondary | ICD-10-CM | POA: Diagnosis not present

## 2021-07-07 DIAGNOSIS — M5386 Other specified dorsopathies, lumbar region: Secondary | ICD-10-CM | POA: Diagnosis not present

## 2021-07-07 NOTE — Telephone Encounter (Signed)
Tried to reach out to [place] lab services twice with no answer so will try again later and see how we can go about getting this ordered for the pt

## 2021-07-07 NOTE — Telephone Encounter (Signed)
Patient states she has stopped by [facility] to get lab work done twice. Patient states she was told she is a "difficult stick" and was advised by phlebotomist at [place] to request lab work be sent to a hospital where they could do an ultrasound to draw blood work. Patient states she is in [place] so whichever hosptial is fine.  Best contact number: [phone]

## 2021-07-07 NOTE — Telephone Encounter (Signed)
Please call patient.  And see which hospital she wants to get bloodwork drawn then call the hospital and fax over lab orders.   Thank you.

## 2021-07-09 DIAGNOSIS — J4540 Moderate persistent asthma, uncomplicated: Secondary | ICD-10-CM | POA: Diagnosis not present

## 2021-07-09 DIAGNOSIS — L209 Atopic dermatitis, unspecified: Secondary | ICD-10-CM | POA: Diagnosis not present

## 2021-07-09 DIAGNOSIS — K2100 Gastro-esophageal reflux disease with esophagitis, without bleeding: Secondary | ICD-10-CM | POA: Diagnosis not present

## 2021-07-09 DIAGNOSIS — R9431 Abnormal electrocardiogram [ECG] [EKG]: Secondary | ICD-10-CM | POA: Diagnosis not present

## 2021-07-09 DIAGNOSIS — Z79899 Other long term (current) drug therapy: Secondary | ICD-10-CM | POA: Diagnosis not present

## 2021-07-13 DIAGNOSIS — M9903 Segmental and somatic dysfunction of lumbar region: Secondary | ICD-10-CM | POA: Diagnosis not present

## 2021-07-13 DIAGNOSIS — M5386 Other specified dorsopathies, lumbar region: Secondary | ICD-10-CM | POA: Diagnosis not present

## 2021-07-13 DIAGNOSIS — M5431 Sciatica, right side: Secondary | ICD-10-CM | POA: Diagnosis not present

## 2021-07-13 DIAGNOSIS — M9902 Segmental and somatic dysfunction of thoracic region: Secondary | ICD-10-CM | POA: Diagnosis not present

## 2021-07-15 NOTE — Progress Notes (Signed)
° °  Barbara Espinoza 08-12-1970 [id number]   History: Perimenopausal 51 y.o. presents for annual exam. No concerns.   Gynecologic History Last Pap: 02/2018. Results were: normal Last mammogram: 2019. Results were: normal Last colonoscopy: 2019, overdue HRT use: no  Obstetric History OB History  Gravida Para Term Preterm AB Living  0 0 0 0 0 0  SAB IAB Ectopic Multiple Live Births  0 0 0 0 0     The following portions of the patient's history were reviewed and updated as appropriate: allergies, current medications, past family history, past medical history, past social history, past surgical history, and problem list.  Review of Systems Pertinent items noted in HPI and remainder of comprehensive ROS otherwise negative.  Past medical history, past surgical history, family history and social history were all reviewed and documented in the EPIC chart.  Exam:  There were no vitals filed for this visit. There is no height or weight on file to calculate BMI.  General appearance:  Normal Thyroid:  Symmetrical, normal in size, without palpable masses or nodularity. Respiratory  Auscultation:  Clear without wheezing or rhonchi Cardiovascular  Auscultation:  Regular rate, without rubs, murmurs or gallops  Edema/varicosities:  Not grossly evident Abdominal  Soft,nontender, without masses, guarding or rebound.  Liver/spleen:  No organomegaly noted  Hernia:  None appreciated  Skin  Inspection:  Grossly normal Breasts: Examined lying and sitting.   Right: Without masses, retractions, nipple discharge or axillary adenopathy.   Left: Without masses, retractions, nipple discharge or axillary adenopathy. Genitourinary   Inguinal/mons:  Normal without inguinal adenopathy  External genitalia:  Normal appearing vulva with no masses, tenderness, or lesions  BUS/Urethra/Skene's glands:  Normal  Vagina:  Normal appearing with normal color and discharge, no lesions.  Cervix:  Normal  appearing without discharge or lesions  Uterus:  Normal in size, shape and contour.  Midline and mobile, nontender  Adnexa/parametria:     Rt: Normal in size, without masses or tenderness.   Lt: Normal in size, without masses or tenderness.  Anus and perineum: Normal   Patient informed chaperone available to be present for breast and pelvic exam. Patient has requested no chaperone to be present. Patient has been advised what will be completed during breast and pelvic exam.   Assessment/Plan:   - Well woman exam -Pap with cotesting -Mammo overdue, will schedule - DEXA at 84 -Colonoscopy due- had to reschedule due to asthma exacerbation  Discussed SBE, colonoscopy and DEXA screening as directed. Recommend 11mins of exercise weekly, including weight bearing exercise. Encouraged the use of seatbelts and sunscreen.  Return in 1 year for annual or sooner prn.  Rubbie Battiest B WHNP-BC, 2:04 PM 07/15/2021

## 2021-07-16 ENCOUNTER — Other Ambulatory Visit (HOSPITAL_COMMUNITY)
Admission: RE | Admit: 2021-07-16 | Discharge: 2021-07-16 | Disposition: A | Discharge status: Home / Self Care | Payer: Federal, State, Local not specified - PPO | Source: Ambulatory Visit | Attending: Radiology | Admitting: Radiology

## 2021-07-16 ENCOUNTER — Ambulatory Visit (INDEPENDENT_AMBULATORY_CARE_PROVIDER_SITE_OTHER): Payer: Federal, State, Local not specified - PPO | Admitting: Radiology

## 2021-07-16 ENCOUNTER — Other Ambulatory Visit: Payer: Self-pay

## 2021-07-16 ENCOUNTER — Encounter: Payer: Self-pay | Admitting: Radiology

## 2021-07-16 VITALS — BP 124/86 | Ht 65.0 in | Wt 206.0 lb

## 2021-07-16 DIAGNOSIS — Z01419 Encounter for gynecological examination (general) (routine) without abnormal findings: Secondary | ICD-10-CM | POA: Diagnosis not present

## 2021-07-20 DIAGNOSIS — M9903 Segmental and somatic dysfunction of lumbar region: Secondary | ICD-10-CM | POA: Diagnosis not present

## 2021-07-20 DIAGNOSIS — M9902 Segmental and somatic dysfunction of thoracic region: Secondary | ICD-10-CM | POA: Diagnosis not present

## 2021-07-20 DIAGNOSIS — J479 Bronchiectasis, uncomplicated: Secondary | ICD-10-CM | POA: Diagnosis not present

## 2021-07-20 DIAGNOSIS — T7800XD Anaphylactic reaction due to unspecified food, subsequent encounter: Secondary | ICD-10-CM | POA: Diagnosis not present

## 2021-07-20 DIAGNOSIS — M5386 Other specified dorsopathies, lumbar region: Secondary | ICD-10-CM | POA: Diagnosis not present

## 2021-07-20 DIAGNOSIS — M5431 Sciatica, right side: Secondary | ICD-10-CM | POA: Diagnosis not present

## 2021-07-20 DIAGNOSIS — J310 Chronic rhinitis: Secondary | ICD-10-CM | POA: Diagnosis not present

## 2021-07-20 DIAGNOSIS — J4551 Severe persistent asthma with (acute) exacerbation: Secondary | ICD-10-CM | POA: Diagnosis not present

## 2021-07-20 LAB — CYTOLOGY - PAP
Comment: NEGATIVE
Diagnosis: NEGATIVE
High risk HPV: NEGATIVE

## 2021-07-20 NOTE — Telephone Encounter (Signed)
Attempted to call patient, there was no answer and then received a busy tone at the end of call, will need to attempt to try again.

## 2021-07-21 ENCOUNTER — Other Ambulatory Visit: Payer: Self-pay

## 2021-07-21 DIAGNOSIS — B379 Candidiasis, unspecified: Secondary | ICD-10-CM

## 2021-07-21 MED ORDER — FLUCONAZOLE 150 MG PO TABS: 150.0000 mg | ORAL_TABLET | Freq: Once | ORAL | 0 refills | Status: AC

## 2021-07-21 NOTE — Telephone Encounter (Signed)
Attempted to call patient, there was no answer and received busy tone so was unable to leave a voicemail.

## 2021-07-22 NOTE — Telephone Encounter (Signed)
Called and spoke with patient, patient stated that she came in and had her labs drawn this past Monday in our [place] office.  ?

## 2021-07-26 LAB — ALLERGENS W/TOTAL IGE AREA 2
Alternaria Alternata IgE: 11.8 kU/L — AB
Aspergillus Fumigatus IgE: 27.8 kU/L — AB
Bermuda Grass IgE: 26.9 kU/L — AB
Cat Dander IgE: 39.3 kU/L — AB
Cedar, Mountain IgE: 31 kU/L — AB
Cladosporium Herbarum IgE: 8.65 kU/L — AB
Cockroach, German IgE: 24.1 kU/L — AB
Common Silver Birch IgE: 29.9 kU/L — AB
Cottonwood IgE: 26.5 kU/L — AB
D Farinae IgE: >100 kU/L — AB
D Pteronyssinus IgE: >100 kU/L — AB
Dog Dander IgE: >100 kU/L — AB
Elm, American IgE: 49.6 kU/L — AB
IgE (Immunoglobulin E), Serum: 12124 IU/mL — ABNORMAL HIGH (ref 6–495)
Johnson Grass IgE: 30.9 kU/L — AB
Maple/Box Elder IgE: 33.8 kU/L — AB
Mouse Urine IgE: 7.25 kU/L — AB
Oak, White IgE: 30.1 kU/L — AB
Pecan, Hickory IgE: 26.1 kU/L — AB
Penicillium Chrysogen IgE: 26.2 kU/L — AB
Pigweed, Rough IgE: 24.6 kU/L — AB
Ragweed, Short IgE: 35.9 kU/L — AB
Sheep Sorrel IgE Qn: 32.3 kU/L — AB
Timothy Grass IgE: 30.2 kU/L — AB
White Mulberry IgE: 18.6 kU/L — AB

## 2021-07-26 LAB — CBC WITH DIFFERENTIAL/PLATELET
Basophils Absolute: 0 10*3/uL (ref 0.0–0.2)
Basos: 0 %
EOS (ABSOLUTE): 0.2 10*3/uL (ref 0.0–0.4)
Eos: 3 %
Hematocrit: 42.4 % (ref 34.0–46.6)
Hemoglobin: 14.5 g/dL (ref 11.1–15.9)
Immature Grans (Abs): 0 10*3/uL (ref 0.0–0.1)
Immature Granulocytes: 0 %
Lymphocytes Absolute: 1.4 10*3/uL (ref 0.7–3.1)
Lymphs: 22 %
MCH: 34 pg — ABNORMAL HIGH (ref 26.6–33.0)
MCHC: 34.2 g/dL (ref 31.5–35.7)
MCV: 99 fL — ABNORMAL HIGH (ref 79–97)
Monocytes Absolute: 0.5 10*3/uL (ref 0.1–0.9)
Monocytes: 7 %
Neutrophils Absolute: 4.3 10*3/uL (ref 1.4–7.0)
Neutrophils: 68 %
Platelets: 249 10*3/uL (ref 150–450)
RBC: 4.27 x10E6/uL (ref 3.77–5.28)
RDW: 13.6 % (ref 11.7–15.4)
WBC: 6.4 10*3/uL (ref 3.4–10.8)

## 2021-07-26 LAB — STREP PNEUMONIAE 23 SEROTYPES IGG
Pneumo Ab Type 1*: 0.3 ug/mL — ABNORMAL LOW (ref >1.3)
Pneumo Ab Type 12 (12F)*: <0.1 ug/mL — ABNORMAL LOW (ref >1.3)
Pneumo Ab Type 14*: 3.9 ug/mL (ref >1.3)
Pneumo Ab Type 17 (17F)*: 0.2 ug/mL — ABNORMAL LOW (ref >1.3)
Pneumo Ab Type 19 (19F)*: 2.1 ug/mL (ref >1.3)
Pneumo Ab Type 2*: 2.3 ug/mL (ref >1.3)
Pneumo Ab Type 20*: 1.5 ug/mL (ref >1.3)
Pneumo Ab Type 22 (22F)*: 0.5 ug/mL — ABNORMAL LOW (ref >1.3)
Pneumo Ab Type 23 (23F)*: <0.1 ug/mL — ABNORMAL LOW (ref >1.3)
Pneumo Ab Type 26 (6B)*: 0.2 ug/mL — ABNORMAL LOW (ref >1.3)
Pneumo Ab Type 3*: 0.2 ug/mL — ABNORMAL LOW (ref >1.3)
Pneumo Ab Type 34 (10A)*: 0.4 ug/mL — ABNORMAL LOW (ref >1.3)
Pneumo Ab Type 4*: 0.2 ug/mL — ABNORMAL LOW (ref >1.3)
Pneumo Ab Type 43 (11A)*: 0.5 ug/mL — ABNORMAL LOW (ref >1.3)
Pneumo Ab Type 5*: 0.2 ug/mL — ABNORMAL LOW (ref >1.3)
Pneumo Ab Type 51 (7F)*: 0.3 ug/mL — ABNORMAL LOW (ref >1.3)
Pneumo Ab Type 54 (15B)*: 0.7 ug/mL — ABNORMAL LOW (ref >1.3)
Pneumo Ab Type 56 (18C)*: <0.1 ug/mL — ABNORMAL LOW (ref >1.3)
Pneumo Ab Type 57 (19A)*: 1.4 ug/mL (ref >1.3)
Pneumo Ab Type 68 (9V)*: 0.1 ug/mL — ABNORMAL LOW (ref >1.3)
Pneumo Ab Type 70 (33F)*: 0.5 ug/mL — ABNORMAL LOW (ref >1.3)
Pneumo Ab Type 8*: 8.1 ug/mL (ref >1.3)
Pneumo Ab Type 9 (9N)*: 0.2 ug/mL — ABNORMAL LOW (ref >1.3)

## 2021-07-26 LAB — DIPHTHERIA / TETANUS ANTIBODY PANEL
Diphtheria Ab: 0.34 IU/mL (ref <0.10)
Tetanus Ab, IgG: 4.49 IU/mL (ref <0.10)

## 2021-07-26 LAB — IGE NUT PROF. W/COMPONENT RFLX
F017-IgE Hazelnut (Filbert): 20.1 kU/L — AB
F018-IgE Brazil Nut: 3.15 kU/L — AB
F020-IgE Almond: 23.4 kU/L — AB
F202-IgE Cashew Nut: 1.84 kU/L — AB
F203-IgE Pistachio Nut: 23.2 kU/L — AB
F256-IgE Walnut: 22.7 kU/L — AB
Macadamia Nut, IgE: 18.4 kU/L — AB
Peanut, IgE: 28 kU/L — AB
Pecan Nut IgE: 4.81 kU/L — AB

## 2021-07-26 LAB — ALLERGEN PROFILE, SHELLFISH
Clam IgE: 10.6 kU/L — AB
F023-IgE Crab: 16 kU/L — AB
F080-IgE Lobster: 12.2 kU/L — AB
F290-IgE Oyster: 19 kU/L — AB
Scallop IgE: 16.7 kU/L — AB
Shrimp IgE: 23.6 kU/L — AB

## 2021-07-26 LAB — COMPLEMENT, TOTAL: Compl, Total (CH50): >60 U/mL (ref >41)

## 2021-07-26 LAB — PANEL 604721
Jug R 1 IgE: 1.25 kU/L — AB
Jug R 3 IgE: 6.09 kU/L — AB

## 2021-07-26 LAB — PEANUT COMPONENTS
F352-IgE Ara h 8: 1.2 kU/L — AB
F422-IgE Ara h 1: 0.97 kU/L — AB
F423-IgE Ara h 2: 1.09 kU/L — AB
F424-IgE Ara h 3: 0.61 kU/L — AB
F427-IgE Ara h 9: 1.52 kU/L — AB
F447-IgE Ara h 6: 1.25 kU/L — AB

## 2021-07-26 LAB — IGG, IGA, IGM
IgA/Immunoglobulin A, Serum: 239 mg/dL (ref 87–352)
IgG (Immunoglobin G), Serum: 1307 mg/dL (ref 586–1602)
IgM (Immunoglobulin M), Srm: 74 mg/dL (ref 26–217)

## 2021-07-26 LAB — PANEL 604726
Cor A 1 IgE: 1.51 kU/L — AB
Cor A 14 IgE: 0.68 kU/L — AB
Cor A 8 IgE: 2.18 kU/L — AB
Cor A 9 IgE: 6.37 kU/L — AB

## 2021-07-26 LAB — PANEL 604350: Ber E 1 IgE: 1.05 kU/L — AB

## 2021-07-26 LAB — PANEL 604239: ANA O 3 IgE: 1.18 kU/L — AB

## 2021-07-26 LAB — ALLERGEN COMPONENT COMMENTS

## 2021-07-26 LAB — ALPHA-1-ANTITRYPSIN: A-1 Antitrypsin: 116 mg/dL (ref 101–187)

## 2021-07-26 LAB — ANA W/REFLEX: Anti Nuclear Antibody (ANA): NEGATIVE

## 2021-08-06 DIAGNOSIS — J4540 Moderate persistent asthma, uncomplicated: Secondary | ICD-10-CM | POA: Diagnosis not present

## 2021-08-06 DIAGNOSIS — L209 Atopic dermatitis, unspecified: Secondary | ICD-10-CM | POA: Diagnosis not present

## 2021-08-06 DIAGNOSIS — Z79899 Other long term (current) drug therapy: Secondary | ICD-10-CM | POA: Diagnosis not present

## 2021-08-06 DIAGNOSIS — K2100 Gastro-esophageal reflux disease with esophagitis, without bleeding: Secondary | ICD-10-CM | POA: Diagnosis not present

## 2021-08-11 DIAGNOSIS — J4551 Severe persistent asthma with (acute) exacerbation: Secondary | ICD-10-CM | POA: Diagnosis not present

## 2021-08-17 DIAGNOSIS — M9902 Segmental and somatic dysfunction of thoracic region: Secondary | ICD-10-CM | POA: Diagnosis not present

## 2021-08-17 DIAGNOSIS — M5431 Sciatica, right side: Secondary | ICD-10-CM | POA: Diagnosis not present

## 2021-08-17 DIAGNOSIS — M9903 Segmental and somatic dysfunction of lumbar region: Secondary | ICD-10-CM | POA: Diagnosis not present

## 2021-08-17 DIAGNOSIS — M5386 Other specified dorsopathies, lumbar region: Secondary | ICD-10-CM | POA: Diagnosis not present

## 2021-08-20 ENCOUNTER — Ambulatory Visit: Payer: Federal, State, Local not specified - PPO | Admitting: Allergy

## 2021-08-20 VITALS — BP 130/76 | HR 76 | Temp 98.0°F | Resp 16 | Ht 65.0 in | Wt 210.4 lb

## 2021-08-20 DIAGNOSIS — J3089 Other allergic rhinitis: Secondary | ICD-10-CM | POA: Diagnosis not present

## 2021-08-20 DIAGNOSIS — R768 Other specified abnormal immunological findings in serum: Secondary | ICD-10-CM

## 2021-08-20 DIAGNOSIS — B999 Unspecified infectious disease: Secondary | ICD-10-CM

## 2021-08-20 DIAGNOSIS — J479 Bronchiectasis, uncomplicated: Secondary | ICD-10-CM | POA: Diagnosis not present

## 2021-08-20 DIAGNOSIS — T7800XD Anaphylactic reaction due to unspecified food, subsequent encounter: Secondary | ICD-10-CM

## 2021-08-20 DIAGNOSIS — J4551 Severe persistent asthma with (acute) exacerbation: Secondary | ICD-10-CM

## 2021-08-20 DIAGNOSIS — L2089 Other atopic dermatitis: Secondary | ICD-10-CM | POA: Diagnosis not present

## 2021-08-20 MED ORDER — MONTELUKAST SODIUM 10 MG PO TABS: 10.0000 mg | ORAL_TABLET | Freq: Every day | ORAL | 5 refills | Status: AC

## 2021-08-20 NOTE — Progress Notes (Signed)
? ?Follow Up Note ? ?RE: Barbara Espinoza MRN: [id number] DOB: 1971-02-12 ?Date of Office Visit: 08/20/2021 ? ?Referring provider: Vincente Liberty, MD ?Primary care provider: Vincente Liberty, MD ? ?Chief Complaint: Asthma (Flare every month - flem, shortness of breath, recently went to urgent medical for an asthma flare ) and Other (07/09/21 patient had an EKG done brought it in for Dr. Maudie Mercury to review ) ? ?History of Present Illness: ?I had the pleasure of seeing Barbara Espinoza for a follow up visit at the [facility] on 08/24/2021. She is a 51 y.o. female, who is being followed for asthma, bronchiectasis, rhinitis, food allergy, atopic dermatitis. Her previous allergy office visit was on 07/02/2021 with Dr. Maudie Mercury. Today is a regular follow up visit. ? ?Asthma ?Currently on Breztri 2 puffs twice a day with some benefit. ?Still smoking a few cigarettes per day. ?Using albuterol 2-3 times per day for the past week. ?She just finished her prednisone with some benefit. But still wheezing. ?  ?Bronchiectasis/infections ?No antibiotics since the last visit. ?Got pneumonia vaccine (prevnar 20) 1 week ago. ? ?Allergic rhinitis ?Still having nasal congestion, sneezing, rhinorrhea. ?Currently taking Claritin once a day. ?Tried Ryaltris but didn't like the taste of it. ? ?Food allergy ?Avoiding peanuts, tree nuts, shellfish. ?No reactions.  ?  ?Atopic dermatitis ?Cleared up since on steroids. ?Used to be on Dupixent and after 12 week her skin felt raw.  ?No recent dermatology visit.  ? ?Patient brought EKG - showed sinus bradycardia - HR 59. ?No issues with bowel movement. ?No prior skin abscesses. ?No retinae primary teeth. ?Toe fracture ?No personal history of cancer.  ?No scoliosis.  ? ?Assessment and Plan: ?Barbara Espinoza is a 51 y.o. female with: ?Not well controlled asthma with acute exacerbation ?Past history - Diagnosed with asthma over 20+ years ago. Not on any maintenance inhalers - some caused thrush in  the past. Tried Advair, Wixela and Symbicort.  5 courses of prednisone this year. Current smoker. Was on Dupixent for eczema but stopped due to some side effects. 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 Clinically feeling improved. 06/25/2021 CXR no acute process. ?Interim history - still smoking, finished prednisone but still wheezing.  ?Today's spirometry showed: restriction. ?Discussed smoking cessation.  ?Start prednisone taper. Prednisone '10mg'$  tablets - take 2 tablets for 4 days then 1 tablet on day 5.  ?Take omeprazole while on steroids.  ?Start Berna Bue - will start PA process. (eos 200) ?Daily controller medication(s): continue Breztri 2 puffs twice a day with spacer and rinse mouth afterwards. ?Continue Budesonide 0.'5mg'$  nebulizer twice a day until you come see me. ?During upper respiratory infections/asthma flares:  ?Start budesonide 0.'5mg'$  nebulizer twice a day for 1-2 weeks until your breathing symptoms return to baseline.  ?Pretreat with albuterol 2 puffs or albuterol nebulizer.  ?If you need to use your albuterol nebulizer machine back to back within 15-30 minutes with no relief then please go to the ER/urgent care for further evaluation.  ?May use albuterol rescue inhaler 2 puffs or nebulizer every 4 to 6 hours as needed for shortness of breath, chest tightness, coughing, and wheezing. May use albuterol rescue inhaler 2 puffs 5 to 15 minutes prior to strenuous physical activities. Monitor frequency of use.  ?Get spirometry at next visit. ? ?Bronchiectasis without complication ([place]) ?Past history - 06/25/2021 CXR showed minimal central and infrahilar bronchiectasis appears stable sine prior examination. Denies any pneumonias,  bronchitis or frequent infections.  ?Interim history - 2023 bloodwork normal IgG, low pneumococcal titers. Got prevnar 20. No additional antibiotics. Normal alpha-1 antitrypsin level.  ?Keep track  of infections and antibiotics use. ?Get bloodwork. ? ?Other allergic rhinitis ?2023 Environmental allergy panel was positive to dust mites, cat, dog, grass, mold, trees, mouse, ragweed, weed pollen but IgE was very high (12K). Does not like Ryaltris. ?Use over the counter antihistamines such as Zyrtec (cetirizine), Claritin (loratadine), Allegra (fexofenadine), or Xyzal (levocetirizine) daily as needed. May take twice a day during allergy flares. May switch antihistamines every few months. ?Start Singulair (montelukast) '10mg'$  daily at night. ?Cautioned that in some children/adults can experience behavioral changes including hyperactivity, agitation, depression, sleep disturbances and suicidal ideations. These side effects are rare, but if you notice them you should notify me and discontinue Singulair (montelukast). ?Nasal saline spray (i.e., Simply Saline) or nasal saline lavage (i.e., NeilMed) is recommended as needed and prior to medicated nasal sprays. ?See below for environmental control measures.  ? ?Anaphylactic reaction due to food, subsequent encounter ?Past history - Peanuts caused hives and wheezing. Avoiding shellfish and tree nuts due to positive testing in the past. Tolerates shrimp.  ?Interim history - 2023 bloodwork positive to tree nuts, peanuts, shellfish, mollusks.  More likely to be anaphylactic to hazelnuts, walnuts, cashews, [place] nuts, peanuts. ?Continue strict avoidance of peanuts, tree nuts, shellfish, mollusks. ?For mild symptoms you can take over the counter antihistamines such as Benadryl and monitor symptoms closely. If symptoms worsen or if you have severe symptoms including breathing issues, throat closure, significant swelling, whole body hives, severe diarrhea and vomiting, lightheadedness then inject epinephrine and seek immediate medical care afterwards. ?Emergency action plan in place.  ?Consider skin testing in future.  ? ?Other atopic dermatitis ?Past history - Saw dermatology  and was on [place] in the past. Stopped as it caused her skin to feel hypersensitive. ?Interim history - cleared up since on recent prednisone. ?Use epicerum twice a day as a moisturizer. Samples given. ?If it works well let us know and will send in a prescription.  ?Continue proper skin care.  ?Moisturizer: Triamcinolone-Eucerin twice a day. ?For more than twice a day use the following: Aquaphor, Vaseline, Cerave, Cetaphil, Eucerin, Vanicream.  ? ?Elevated IgE level ?Ige level over 12K. Denies skin abscesses, retained primary teeth, fractures, scoliosis, GI issues.  ?Discussed with patient at length that this is most likely due to her eczema however given levels need to get some additional bloodwork.  ? ?Return in about 6 weeks (around 10/01/2021). ? ?Meds ordered this encounter  ?Medications  ? montelukast (SINGULAIR) 10 MG tablet  ?  Sig: Take 1 tablet (10 mg total) by mouth at bedtime.  ?  Dispense:  30 tablet  ?  Refill:  5  ? ?Lab Orders    ?     Ova and parasite examination    ?     Strep pneumoniae 23 Serotypes IgG    ?     HIES: STAT3    ?     HIV Antibody (routine testing w rflx)    ?     Aspergillus Precipitins    ?     Aspergillus IgE Panel    ?     ANCA Profile    ?     C3 and C4    ?     Urinalysis    ?     IgE    ? ? ?Diagnostics: ?Spirometry:  ?Tracings reviewed.  Her effort: Good reproducible efforts. ?FVC: 2.05L ?FEV1: 1.49L, 52% predicted ?FEV1/FVC ratio: 73% ?Interpretation: Spirometry consistent with possible restrictive disease.  ?Please see scanned spirometry results for details. ? ?Medication List:  ?Current Outpatient Medications  ?Medication Sig Dispense Refill  ? montelukast (SINGULAIR) 10 MG tablet Take 1 tablet (10 mg total) by mouth at bedtime. 30 tablet 5  ? albuterol (PROVENTIL HFA;VENTOLIN HFA) 108 (90 BASE) MCG/ACT inhaler Inhale 2 puffs into the lungs every 4 (four) hours as needed. Shortness of breath and wheezing     ? albuterol (PROVENTIL) (2.5 MG/3ML) 0.083% nebulizer solution  Take 2.5 mg by nebulization every 6 (six) hours as needed. Shortness of breath and wheezing     ? Budeson-Glycopyrrol-Formoterol (BREZTRI AEROSPHERE) 160-9-4.8 MCG/ACT AERO Inhale 2 puffs into the lungs i

## 2021-08-20 NOTE — Patient Instructions (Addendum)
Make sure to get bloodwork in 3 weeks at our [place] office. ?Call before coming and make sure Ernst Bowler is there.  ?Check if your insurance covers the STAT3 genetic testing.  ? ?Decrease your smoking.  ? ?Asthma:  ?Start prednisone taper. Prednisone '10mg'$  tablets - take 2 tablets for 4 days then 1 tablet on day 5.  ?Take omeprazole while on steroids.  ?Start Berna Bue - will start PA process.  ?Daily controller medication(s): continue Breztri 2 puffs twice a day with spacer and rinse mouth afterwards. ?Continue Budesonide 0.'5mg'$  nebulizer twice a day until you come see me. ?During upper respiratory infections/asthma flares:  ?Start budesonide 0.'5mg'$  nebulizer twice a day for 1-2 weeks until your breathing symptoms return to baseline.  ?Pretreat with albuterol 2 puffs or albuterol nebulizer.  ?If you need to use your albuterol nebulizer machine back to back within 15-30 minutes with no relief then please go to the ER/urgent care for further evaluation.  ?May use albuterol rescue inhaler 2 puffs or nebulizer every 4 to 6 hours as needed for shortness of breath, chest tightness, coughing, and wheezing. May use albuterol rescue inhaler 2 puffs 5 to 15 minutes prior to strenuous physical activities. Monitor frequency of use.  ?Asthma control goals:  ?Full participation in all desired activities (may need albuterol before activity) ?Albuterol use two times or less a week on average (not counting use with activity) ?Cough interfering with sleep two times or less a month ?Oral steroids no more than once a year ?No hospitalizations  ? ?Infections ?Keep track of infections and antibiotics use. ? ?Rhinitis:  ?Use over the counter antihistamines such as Zyrtec (cetirizine), Claritin (loratadine), Allegra (fexofenadine), or Xyzal (levocetirizine) daily as needed. May take twice a day during allergy flares. May switch antihistamines every few months. ?Start Singulair (montelukast) '10mg'$  daily at night. ?Cautioned that in some  children/adults can experience behavioral changes including hyperactivity, agitation, depression, sleep disturbances and suicidal ideations. These side effects are rare, but if you notice them you should notify me and discontinue Singulair (montelukast). ?Nasal saline spray (i.e., Simply Saline) or nasal saline lavage (i.e., NeilMed) is recommended as needed and prior to medicated nasal sprays. ?See below for environmental control measures.  ? ?Food allergies ?Continue strict avoidance of peanuts, tree nuts, shellfish, mollusks. ?For mild symptoms you can take over the counter antihistamines such as Benadryl and monitor symptoms closely. If symptoms worsen or if you have severe symptoms including breathing issues, throat closure, significant swelling, whole body hives, severe diarrhea and vomiting, lightheadedness then inject epinephrine and seek immediate medical care afterwards. ?Emergency action plan in place.  ? ?Eczema: ?Use epicerum twice a day as a moisturizer. ?If it works well let us know and will send in a prescription.  ?Continue proper skin care.  ?Moisturizer: Triamcinolone-Eucerin twice a day. ?For more than twice a day use the following: Aquaphor, Vaseline, Cerave, Cetaphil, Eucerin, Vanicream.  ? ?Follow up in 6 weeks or sooner if needed. ? ?Reducing Pollen Exposure ?Pollen seasons: trees (spring), grass (summer) and ragweed/weeds (fall). ?Keep windows closed in your home and car to lower pollen exposure.  ?Install air conditioning in the bedroom and throughout the house if possible.  ?Avoid going out in dry windy days - especially early morning. ?Pollen counts are highest between 5 - 10 AM and on dry, hot and windy days.  ?Save outside activities for late afternoon or after a heavy rain, when pollen levels are lower.  ?Avoid mowing of grass if you have grass pollen  allergy. ?Be aware that pollen can also be transported indoors on people and pets.  ?Dry your clothes in an automatic dryer rather than  hanging them outside where they might collect pollen.  ?Rinse hair and eyes before bedtime. ?Mold Control ?Mold and fungi can grow on a variety of surfaces provided certain temperature and moisture conditions exist.  ?Outdoor molds grow on plants, decaying vegetation and soil. The major outdoor mold, Alternaria and Cladosporium, are found in very high numbers during hot and dry conditions. Generally, a late summer - fall peak is seen for common outdoor fungal spores. Rain will temporarily lower outdoor mold spore count, but counts rise rapidly when the rainy period ends. ?The most important indoor molds are Aspergillus and Penicillium. Dark, humid and poorly ventilated basements are ideal sites for mold growth. The next most common sites of mold growth are the bathroom and the kitchen. ?Outdoor (Seasonal) Mold Control ?Use air conditioning and keep windows closed. ?Avoid exposure to decaying vegetation. ?Avoid leaf raking. ?Avoid grain handling. ?Consider wearing a face mask if working in moldy areas.  ?Indoor (Perennial) Mold Control  ?Maintain humidity below 50%. ?Get rid of mold growth on hard surfaces with water, detergent and, if necessary, 5% bleach (do not mix with other cleaners). Then dry the area completely. If mold covers an area more than 10 square feet, consider hiring an indoor environmental professional. ?For clothing, washing with soap and water is best. If moldy items cannot be cleaned and dried, throw them away. ?Remove sources e.g. contaminated carpets. ?Repair and seal leaking roofs or pipes. Using dehumidifiers in damp basements may be helpful, but empty the water and clean units regularly to prevent mildew from forming. All rooms, especially basements, bathrooms and kitchens, require ventilation and cleaning to deter mold and mildew growth. Avoid carpeting on concrete or damp floors, and storing items in damp areas. ?Control of House Dust Mite Allergen ?Dust mite allergens are a common trigger  of allergy and asthma symptoms. While they can be found throughout the house, these microscopic creatures thrive in warm, humid environments such as bedding, upholstered furniture and carpeting. ?Because so much time is spent in the bedroom, it is essential to reduce mite levels there.  ?Encase pillows, mattresses, and box springs in special allergen-proof fabric covers or airtight, zippered plastic covers.  ?Bedding should be washed weekly in hot water (130? F) and dried in a hot dryer. Allergen-proof covers are available for comforters and pillows that can?t be regularly washed.  ?Wash the allergy-proof covers every few months. Minimize clutter in the bedroom. Keep pets out of the bedroom.  ?Keep humidity less than 50% by using a dehumidifier or air conditioning. You can buy a humidity measuring device called a hygrometer to monitor this.  ?If possible, replace carpets with hardwood, linoleum, or washable area rugs. If that's not possible, vacuum frequently with a vacuum that has a HEPA filter. ?Remove all upholstered furniture and non-washable window drapes from the bedroom. ?Remove all non-washable stuffed toys from the bedroom.  Wash stuffed toys weekly. ?Pet Allergen Avoidance: ?Contrary to popular opinion, there are no ?hypoallergenic? breeds of dogs or cats. That is because people are not allergic to an animal?s hair, but to an allergen found in the animal's saliva, dander (dead skin flakes) or urine. Pet allergy symptoms typically occur within minutes. For some people, symptoms can build up and become most severe 8 to 12 hours after contact with the animal. People with severe allergies can experience reactions in public  places if dander has been transported on the pet owners? clothing. ?Keeping an animal outdoors is only a partial solution, since homes with pets in the yard still have higher concentrations of animal allergens. ?Before getting a pet, ask your allergist to determine if you are allergic to  animals. If your pet is already considered part of your family, try to minimize contact and keep the pet out of the bedroom and other rooms where you spend a great deal of time. ?As with dust mites, vacuum car

## 2021-08-24 ENCOUNTER — Encounter: Payer: Self-pay | Admitting: Allergy

## 2021-08-24 DIAGNOSIS — J3089 Other allergic rhinitis: Secondary | ICD-10-CM | POA: Insufficient documentation

## 2021-08-24 DIAGNOSIS — R768 Other specified abnormal immunological findings in serum: Secondary | ICD-10-CM | POA: Insufficient documentation

## 2021-08-24 DIAGNOSIS — R7689 Other specified abnormal immunological findings in serum: Secondary | ICD-10-CM | POA: Insufficient documentation

## 2021-08-24 NOTE — Assessment & Plan Note (Addendum)
Ige level over 12K. Denies skin abscesses, retained primary teeth, fractures, scoliosis, GI issues.  ?? Discussed with patient at length that this is most likely due to her eczema however given levels need to get some additional bloodwork.  ?

## 2021-08-24 NOTE — Assessment & Plan Note (Signed)
Past history - Diagnosed with asthma over 20+ years ago. Not on any maintenance inhalers - some caused thrush in the past. Tried Advair, Wixela and Symbicort.  5 courses of prednisone this year. Current smoker. Was on Dupixent for eczema but stopped due to some side effects. 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 Clinically feeling improved. 06/25/2021 CXR no acute process. ?Interim history - still smoking, finished prednisone but still wheezing.  ?? Today's spirometry showed: restriction. ?? Discussed smoking cessation.  ?? Start prednisone taper. Prednisone '10mg'$  tablets - take 2 tablets for 4 days then 1 tablet on day 5.  ?? Take omeprazole while on steroids.  ?? Start Berna Bue - will start PA process. (eos 200) ?? Daily controller medication(s): continue Breztri 2 puffs twice a day with spacer and rinse mouth afterwards. ?? Continue Budesonide 0.'5mg'$  nebulizer twice a day until you come see me. ?? During upper respiratory infections/asthma flares:  ?o Start budesonide 0.'5mg'$  nebulizer twice a day for 1-2 weeks until your breathing symptoms return to baseline.  ?o Pretreat with albuterol 2 puffs or albuterol nebulizer.  ?o If you need to use your albuterol nebulizer machine back to back within 15-30 minutes with no relief then please go to the ER/urgent care for further evaluation.  ?? May use albuterol rescue inhaler 2 puffs or nebulizer every 4 to 6 hours as needed for shortness of breath, chest tightness, coughing, and wheezing. May use albuterol rescue inhaler 2 puffs 5 to 15 minutes prior to strenuous physical activities. Monitor frequency of use.  ?? Get spirometry at next visit. ?

## 2021-08-24 NOTE — Assessment & Plan Note (Signed)
Past history - Saw dermatology and was on [place] in the past. Stopped as it caused her skin to feel hypersensitive. ?Interim history - cleared up since on recent prednisone. ?? Use epicerum twice a day as a moisturizer. Samples given. ?o If it works well let us know and will send in a prescription.  ?? Continue proper skin care.  ?? Moisturizer: Triamcinolone-Eucerin twice a day. ?? For more than twice a day use the following: Aquaphor, Vaseline, Cerave, Cetaphil, Eucerin, Vanicream.  ?

## 2021-08-24 NOTE — Assessment & Plan Note (Signed)
2023 Environmental allergy panel was positive to dust mites, cat, dog, grass, mold, trees, mouse, ragweed, weed pollen but IgE was very high (12K). Does not like Ryaltris. ?? Use over the counter antihistamines such as Zyrtec (cetirizine), Claritin (loratadine), Allegra (fexofenadine), or Xyzal (levocetirizine) daily as needed. May take twice a day during allergy flares. May switch antihistamines every few months. ?? Start Singulair (montelukast) '10mg'$  daily at night. ?? Cautioned that in some children/adults can experience behavioral changes including hyperactivity, agitation, depression, sleep disturbances and suicidal ideations. These side effects are rare, but if you notice them you should notify me and discontinue Singulair (montelukast). ?? Nasal saline spray (i.e., Simply Saline) or nasal saline lavage (i.e., NeilMed) is recommended as needed and prior to medicated nasal sprays. ?? See below for environmental control measures.  ?

## 2021-08-24 NOTE — Assessment & Plan Note (Signed)
Past history - Peanuts caused hives and wheezing. Avoiding shellfish and tree nuts due to positive testing in the past. Tolerates shrimp.  ?Interim history - 2023 bloodwork positive to tree nuts, peanuts, shellfish, mollusks.  More likely to be anaphylactic to hazelnuts, walnuts, cashews, [place] nuts, peanuts. ?? Continue strict avoidance of peanuts, tree nuts, shellfish, mollusks. ?? For mild symptoms you can take over the counter antihistamines such as Benadryl and monitor symptoms closely. If symptoms worsen or if you have severe symptoms including breathing issues, throat closure, significant swelling, whole body hives, severe diarrhea and vomiting, lightheadedness then inject epinephrine and seek immediate medical care afterwards. ?? Emergency action plan in place.  ?? Consider skin testing in future.  ?

## 2021-08-24 NOTE — Assessment & Plan Note (Addendum)
Past history - 06/25/2021 CXR showed minimal central and infrahilar bronchiectasis appears stable sine prior examination. Denies any pneumonias, bronchitis or frequent infections.  ?Interim history - 2023 bloodwork normal IgG, low pneumococcal titers. Got prevnar 20. No additional antibiotics. Normal alpha-1 antitrypsin level.  ?? Keep track of infections and antibiotics use. ?? Get bloodwork. ?

## 2021-08-25 DIAGNOSIS — K2100 Gastro-esophageal reflux disease with esophagitis, without bleeding: Secondary | ICD-10-CM | POA: Diagnosis not present

## 2021-08-25 DIAGNOSIS — L209 Atopic dermatitis, unspecified: Secondary | ICD-10-CM | POA: Diagnosis not present

## 2021-08-25 DIAGNOSIS — J4540 Moderate persistent asthma, uncomplicated: Secondary | ICD-10-CM | POA: Diagnosis not present

## 2021-08-25 DIAGNOSIS — Z79899 Other long term (current) drug therapy: Secondary | ICD-10-CM | POA: Diagnosis not present

## 2021-08-26 ENCOUNTER — Encounter: Payer: Self-pay | Admitting: Allergy

## 2021-08-26 MED ORDER — PREDNISONE 10 MG PO TABS: ORAL_TABLET | ORAL | 0 refills | Status: DC

## 2021-08-27 ENCOUNTER — Telehealth: Payer: Self-pay | Admitting: *Deleted

## 2021-08-27 ENCOUNTER — Other Ambulatory Visit: Payer: Self-pay | Admitting: Allergy

## 2021-08-27 NOTE — Telephone Encounter (Signed)
Received denial for patient for Barbara Espinoza due to not meeting criteria of minimum 3 months of compliant use of daily combination inhaler. FEP does look at claim history so I called her pharmacy to see her fill for controllers and she received 1 inhaler Dulera 04/30/21 and 1 inhaler Breztri 07/02/20. So with her not using the controllers her Ins will not approve Fasenra and really cant appeal since she is non-compliant at this time. Please advise ?

## 2021-08-27 NOTE — Telephone Encounter (Signed)
-----   Message from Garnet Sierras, DO sent at 08/20/2021  5:08 PM EDT ----- ?Please start PA for [facility] for asthma. Thank you.  ?

## 2021-08-27 NOTE — Telephone Encounter (Signed)
Called patient and advised her Ins looking at her fill history to determine if she is compliant with her meds.  She advised she had some samples but I advised that Ins will not pay for biologic until she has history for 3 months of Breztri fill ?

## 2021-08-27 NOTE — Telephone Encounter (Signed)
Barbara Espinoza, ? ?Can you please call patient and let her know that she has to be compliant and picking up her meds for at least 3 months before insurance will approve a biologic? ? ?She has a follow up with me in June and will re-visit the Fasenra injection at that time. ? ?Continue with Breztri 2 puffs twice a day. ? ?Thank you.  ?

## 2021-08-31 ENCOUNTER — Encounter: Payer: Self-pay | Admitting: Allergy

## 2021-09-07 DIAGNOSIS — M9903 Segmental and somatic dysfunction of lumbar region: Secondary | ICD-10-CM | POA: Diagnosis not present

## 2021-09-07 DIAGNOSIS — M5386 Other specified dorsopathies, lumbar region: Secondary | ICD-10-CM | POA: Diagnosis not present

## 2021-09-07 DIAGNOSIS — M9902 Segmental and somatic dysfunction of thoracic region: Secondary | ICD-10-CM | POA: Diagnosis not present

## 2021-09-07 DIAGNOSIS — M5431 Sciatica, right side: Secondary | ICD-10-CM | POA: Diagnosis not present

## 2021-09-10 DIAGNOSIS — J479 Bronchiectasis, uncomplicated: Secondary | ICD-10-CM | POA: Diagnosis not present

## 2021-09-10 DIAGNOSIS — B999 Unspecified infectious disease: Secondary | ICD-10-CM | POA: Diagnosis not present

## 2021-09-10 DIAGNOSIS — J4551 Severe persistent asthma with (acute) exacerbation: Secondary | ICD-10-CM | POA: Diagnosis not present

## 2021-09-10 DIAGNOSIS — R768 Other specified abnormal immunological findings in serum: Secondary | ICD-10-CM | POA: Diagnosis not present

## 2021-10-05 DIAGNOSIS — M5431 Sciatica, right side: Secondary | ICD-10-CM | POA: Diagnosis not present

## 2021-10-05 DIAGNOSIS — M5386 Other specified dorsopathies, lumbar region: Secondary | ICD-10-CM | POA: Diagnosis not present

## 2021-10-05 DIAGNOSIS — M9902 Segmental and somatic dysfunction of thoracic region: Secondary | ICD-10-CM | POA: Diagnosis not present

## 2021-10-05 DIAGNOSIS — M9903 Segmental and somatic dysfunction of lumbar region: Secondary | ICD-10-CM | POA: Diagnosis not present

## 2021-10-06 DIAGNOSIS — L209 Atopic dermatitis, unspecified: Secondary | ICD-10-CM | POA: Diagnosis not present

## 2021-10-06 DIAGNOSIS — J4540 Moderate persistent asthma, uncomplicated: Secondary | ICD-10-CM | POA: Diagnosis not present

## 2021-10-06 DIAGNOSIS — K2100 Gastro-esophageal reflux disease with esophagitis, without bleeding: Secondary | ICD-10-CM | POA: Diagnosis not present

## 2021-10-06 DIAGNOSIS — Z79899 Other long term (current) drug therapy: Secondary | ICD-10-CM | POA: Diagnosis not present

## 2021-10-07 ENCOUNTER — Encounter: Payer: Self-pay | Admitting: Allergy

## 2021-10-07 DIAGNOSIS — R768 Other specified abnormal immunological findings in serum: Secondary | ICD-10-CM

## 2021-10-08 LAB — ANCA PROFILE
Anti-MPO Antibodies: <0.2 units (ref 0.0–0.9)
Anti-PR3 Antibodies: <0.2 units (ref 0.0–0.9)
Atypical pANCA: <1:20 {titer}
C-ANCA: <1:20 {titer}
P-ANCA: <1:20 {titer}

## 2021-10-08 LAB — URINALYSIS
Bilirubin, UA: NEGATIVE
Glucose, UA: NEGATIVE
Ketones, UA: NEGATIVE
Leukocytes,UA: NEGATIVE
Nitrite, UA: NEGATIVE
Protein,UA: NEGATIVE
RBC, UA: NEGATIVE
Specific Gravity, UA: 1.015 (ref 1.005–1.030)
Urobilinogen, Ur: 0.2 mg/dL (ref 0.2–1.0)
pH, UA: 5.5 (ref 5.0–7.5)

## 2021-10-08 LAB — STREP PNEUMONIAE 23 SEROTYPES IGG
Pneumo Ab Type 1*: 2.7 ug/mL (ref >1.3)
Pneumo Ab Type 12 (12F)*: 0.2 ug/mL — ABNORMAL LOW (ref >1.3)
Pneumo Ab Type 14*: >18.7 ug/mL (ref >1.3)
Pneumo Ab Type 17 (17F)*: 0.3 ug/mL — ABNORMAL LOW (ref >1.3)
Pneumo Ab Type 19 (19F)*: 7.3 ug/mL (ref >1.3)
Pneumo Ab Type 2*: 5.7 ug/mL (ref >1.3)
Pneumo Ab Type 20*: 4.1 ug/mL (ref >1.3)
Pneumo Ab Type 22 (22F)*: 2.9 ug/mL (ref >1.3)
Pneumo Ab Type 23 (23F)*: 0.6 ug/mL — ABNORMAL LOW (ref >1.3)
Pneumo Ab Type 26 (6B)*: 0.6 ug/mL — ABNORMAL LOW (ref >1.3)
Pneumo Ab Type 3*: 1.3 ug/mL — ABNORMAL LOW (ref >1.3)
Pneumo Ab Type 34 (10A)*: 18.2 ug/mL (ref >1.3)
Pneumo Ab Type 4*: 1.3 ug/mL — ABNORMAL LOW (ref >1.3)
Pneumo Ab Type 43 (11A)*: >7.6 ug/mL (ref >1.3)
Pneumo Ab Type 5*: 2.3 ug/mL (ref >1.3)
Pneumo Ab Type 51 (7F)*: 3.1 ug/mL (ref >1.3)
Pneumo Ab Type 54 (15B)*: >22 ug/mL (ref >1.3)
Pneumo Ab Type 56 (18C)*: 2.8 ug/mL (ref >1.3)
Pneumo Ab Type 57 (19A)*: 4.7 ug/mL (ref >1.3)
Pneumo Ab Type 68 (9V)*: 1.3 ug/mL — ABNORMAL LOW (ref >1.3)
Pneumo Ab Type 70 (33F)*: 2.2 ug/mL (ref >1.3)
Pneumo Ab Type 8*: >25.3 ug/mL (ref >1.3)
Pneumo Ab Type 9 (9N)*: 2 ug/mL (ref >1.3)

## 2021-10-08 LAB — ASPERGILLUS PRECIPITINS
A.Fumigatus #1 Abs: NEGATIVE
Aspergillus Flavus Antibodies: NEGATIVE
Aspergillus Niger Antibodies: NEGATIVE
Aspergillus glaucus IgG: NEGATIVE
Aspergillus nidulans IgG: NEGATIVE
Aspergillus terreus IgG: NEGATIVE

## 2021-10-08 LAB — ASPERGILLUS IGE PANEL

## 2021-10-08 LAB — IGE: IgE (Immunoglobulin E), Serum: 13249 IU/mL — ABNORMAL HIGH (ref 6–495)

## 2021-10-08 LAB — C3 AND C4
Complement C3, Serum: 179 mg/dL — ABNORMAL HIGH (ref 82–167)
Complement C4, Serum: 32 mg/dL (ref 12–38)

## 2021-10-08 NOTE — Telephone Encounter (Signed)
Please call patient and let her know that there was 3 bloodwork orders that could not be run due to insufficient sample.  Please have her come in to get it drawn. Make sure she hydrates before her blood draw.  I put in new orders for those 3 labs.  Thank you.

## 2021-10-21 DIAGNOSIS — R768 Other specified abnormal immunological findings in serum: Secondary | ICD-10-CM | POA: Diagnosis not present

## 2021-11-02 NOTE — Progress Notes (Unsigned)
Follow Up Note  RE: Barbara Espinoza MRN: [id number] DOB: September 05, 1970 Date of Office Visit: 11/03/2021  Referring provider: Vincente Liberty, MD Primary care provider: Vincente Liberty, MD  Chief Complaint: Asthma (Has been horrible asthma has been uncontrolled uses rescue inhaler everyday )  History of Present Illness: I had the pleasure of seeing Barbara Espinoza for a follow up visit at the [name] on 11/04/2021. She is a 51 y.o. female, who is being followed for asthma, bronchiectasis, allergic rhinitis, food allergy, atopic dermatitis and elevated IgE. Her previous allergy office visit was on 08/20/2021 with Dr. Maudie Mercury. Today is a regular follow up visit.  Asthma Currently on Breztri 2 puffs twice a day with spacer and rinsing mouth afterwards. Still having issues with wheezing, chest tightness.  Using albuterol daily the past week with some benefit. Also using budesonide nebulizer.  Still smoking.  She has been taking prednisone '5mg'$  daily and had about 1 additional burst since the last visit. Taking omeprazole with the taper which helps with heartburn symptoms.   Took albuterol around noon today.  She did pick up one Breztri and then has another one that is ready to be picked up at the pharmacy. Patient states she has been using the sample Breztris from out office and her PCP's office and that's why she wasn't getting it filled at her pharmacy.   She is interested in starting the [place] ASAP.   Bronchiectasis without complication No infections/antibiotics use.  Reviewed bloodwork with patient but still awaiting 3 more results.    Allergic rhinitis Taking Singulair daily at night with unknown benefit. Also taking loratadine daily. Does not like nasal sprays.    Food allergy Avoiding peanuts, tree nuts, shellfish, mollusks. No reactions.   Other atopic dermatitis Doing better but also on prednisone which is making her gain weight.   Assessment  and Plan: Barbara Espinoza is a 51 y.o. female with: Not well controlled asthma with acute exacerbation Past history - Diagnosed with asthma over 20+ years ago. Not on any maintenance inhalers - some caused thrush in the past. Tried Advair, Wixela and Symbicort.  5 courses of prednisone this year. Current smoker. Was on Dupixent for eczema but stopped due to some side effects. 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 Clinically feeling improved. 06/25/2021 CXR no acute process. Interim history - still smoking, been on prednisone '5mg'$  daily since last OV and had 1 additional burst of prednisone. She still has Breztri samples and that's why she did not pick up her Rx at the pharmacy.  Patient would benefit from adding on biologics due to her frequent prednisone bursts and currently on daily prednisone '5mg'$ .  Today's spirometry showed some restriction with 29% improvement in FEV1 post bronchodilator treatment. Clinically feeling unchanged but moving more air on exam.  Discussed smoking cessation.  Start prednisone taper. Prednisone '10mg'$  tablets - take 2 tablets for 4 days then 1 tablet on day 5.  Take omeprazole while on steroids.  Start Berna Bue - will start PA process.  Tammy our biologics coordinator will be in touch with you.  Daily controller medication(s): continue Breztri 2 puffs twice a day with spacer and rinse mouth afterwards. During upper respiratory infections/asthma flares:  Start budesonide 0.'5mg'$  nebulizer twice a day for 1-2 weeks until your breathing symptoms return to baseline.  Pretreat with albuterol 2 puffs or albuterol nebulizer.  If you need to use your albuterol nebulizer  machine back to back within 15-30 minutes with no relief then please go to the ER/urgent care for further evaluation.  May use albuterol rescue inhaler 2 puffs or nebulizer every 4 to 6 hours as needed for shortness of breath, chest  tightness, coughing, and wheezing. May use albuterol rescue inhaler 2 puffs 5 to 15 minutes prior to strenuous physical activities. Monitor frequency of use.  Get spirometry at next visit.  Bronchiectasis without complication ([place]) Past history - 06/25/2021 CXR showed minimal central and infrahilar bronchiectasis appears stable since prior examination. Denies any pneumonias, bronchitis or frequent infections. 2023 bloodwork normal IgG, low pneumococcal titers. Got prevnar 20 with good response. Normal alpha-1 antitrypsin level.  Interim history - No additional antibiotics.  Keep track of infections and antibiotics use.  Other allergic rhinitis Past history - 2023 Environmental allergy panel was positive to dust mites, cat, dog, grass, mold, trees, mouse, ragweed, weed pollen but IgE was very high (12K). Does not like Ryaltris. Interim history - stable. Not sure if adding on Singulair is helping. Use over the counter antihistamines such as Zyrtec (cetirizine), Claritin (loratadine), Allegra (fexofenadine), or Xyzal (levocetirizine) daily as needed. May take twice a day during allergy flares. May switch antihistamines every few months. Continue Singulair (montelukast) '10mg'$  daily at night. Nasal saline spray (i.e., Simply Saline) or nasal saline lavage (i.e., NeilMed) is recommended as needed and prior to medicated nasal sprays. Continue environmental control measures.   Anaphylactic reaction due to food, subsequent encounter Past history - Peanuts caused hives and wheezing. Avoiding shellfish and tree nuts due to positive testing in the past. Tolerates shrimp. 2023 bloodwork positive to tree nuts, peanuts, shellfish, mollusks.  More likely to be anaphylactic to hazelnuts, walnuts, cashews, [place] nuts, peanuts. Interim history - no reactions.  Continue strict avoidance of peanuts, tree nuts, shellfish, mollusks. For mild symptoms you can take over the counter antihistamines such as Benadryl and  monitor symptoms closely. If symptoms worsen or if you have severe symptoms including breathing issues, throat closure, significant swelling, whole body hives, severe diarrhea and vomiting, lightheadedness then inject epinephrine and seek immediate medical care afterwards. Emergency action plan in place.   Other atopic dermatitis Past history - Saw dermatology and was on [place] in the past. Stopped as it caused her skin to feel hypersensitive. Interim history - cleared up since on prednisone daily. Use epicerum twice a day as needed. Continue proper skin care.   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 Denies skin abscesses, retained primary teeth, fractures, scoliosis, GI issues.  Interim history - awaiting some bloodwork results.  Discussed with patient at length that this is most likely due to her eczema.  Return in about 2 months (around 01/03/2022).  No orders of the defined types were placed in this encounter.  Lab Orders  No laboratory test(s) ordered today    Diagnostics: Spirometry:  Tracings reviewed. Her effort: Good reproducible efforts. FVC: 1.46L FEV1: 1.05L, 68% predicted FEV1/FVC ratio: 72% Interpretation: Spirometry consistent with possible restrictive disease with 29% improvement in FEV1 post bronchodilator treatment. Clinically feeling unchanged but had better air movement on exam.   Please see scanned spirometry results for details.  Medication List:  Current Outpatient Medications  Medication Sig Dispense Refill   albuterol (PROVENTIL HFA;VENTOLIN HFA) 108 (90 BASE) MCG/ACT inhaler Inhale 2 puffs into the lungs every 4 (four) hours as needed. Shortness of breath and wheezing      albuterol (PROVENTIL) (2.5 MG/3ML) 0.083% nebulizer solution Take 2.5  mg by nebulization every 6 (six) hours as needed. Shortness of breath and wheezing      Budeson-Glycopyrrol-Formoterol (BREZTRI AEROSPHERE) 160-9-4.8 MCG/ACT AERO Inhale 2 puffs into the lungs in the  morning and at bedtime. with spacer and rinse mouth afterwards. 10.7 g 3   budesonide (PULMICORT) 0.5 MG/2ML nebulizer solution INHALE 2 MLS BY NEBULIZATION IN THE MORNING AND AT BEDTIME. 360 mL 0   EPINEPHrine 0.3 mg/0.3 mL IJ SOAJ injection Inject 0.3 mg into the muscle as needed for anaphylaxis. 1 each 2   escitalopram (LEXAPRO) 5 MG tablet Take 1 tablet by mouth daily.     montelukast (SINGULAIR) 10 MG tablet Take 1 tablet (10 mg total) by mouth at bedtime. 30 tablet 5   Multiple Vitamins-Minerals (HAIR SKIN AND NAILS FORMULA) TABS Take by mouth.     omeprazole (PRILOSEC) 20 MG capsule Take 1 capsule (20 mg total) by mouth daily. 30 capsule 1   triamcinolone ointment (KENALOG) 0.1 % Apply 1 application topically daily. Use as a moisturizer. 453 g 3   No current facility-administered medications for this visit.   Allergies: Allergies  Allergen Reactions   Peanut-Containing Drug Products Hives    "NUTS" Reaction: wheezing   Shellfish Allergy Hives    Reaction: wheezing   I reviewed her past medical history, social history, family history, and environmental history and no significant changes have been reported from her previous visit.  Review of Systems  Constitutional:  Negative for appetite change, chills, fever and unexpected weight change.  HENT:  Negative for congestion and rhinorrhea.   Respiratory:  Positive for cough, chest tightness, shortness of breath and wheezing.   Cardiovascular:  Negative for chest pain.  Gastrointestinal:  Negative for abdominal pain.  Genitourinary:  Negative for difficulty urinating.  Skin:  Positive for rash.  Allergic/Immunologic: Positive for environmental allergies and food allergies.  Neurological:  Negative for headaches.    Objective: BP 130/80   Pulse (!) 54   Resp 16   SpO2 98%  There is no height or weight on file to calculate BMI. Physical Exam Vitals and nursing note reviewed.  Constitutional:      Appearance: Normal  appearance. She is well-developed.  HENT:     Head: Normocephalic and atraumatic.     Right Ear: Tympanic membrane and external ear normal.     Left Ear: Tympanic membrane and external ear normal.     Nose: Nose normal.     Mouth/Throat:     Mouth: Mucous membranes are moist.     Pharynx: Oropharynx is clear.  Eyes:     Conjunctiva/sclera: Conjunctivae normal.  Cardiovascular:     Rate and Rhythm: Normal rate and regular rhythm.     Heart sounds: Normal heart sounds. No murmur heard.    No friction rub. No gallop.  Pulmonary:     Effort: Pulmonary effort is normal.     Breath sounds: No wheezing, rhonchi or rales.     Comments: Decreased breath sounds. Musculoskeletal:     Cervical back: Neck supple.  Skin:    General: Skin is warm and dry.  Neurological:     Mental Status: She is alert and oriented to person, place, and time.  Psychiatric:        Behavior: Behavior normal.    Previous notes and tests were reviewed. The plan was reviewed with the patient/family, and all questions/concerned were addressed.  It was my pleasure to see Kaitland today and participate in her care. Please feel  free to contact me with any questions or concerns.  Sincerely,  Rexene Alberts, DO Allergy & Immunology  [facility] office: [place] office: [phone]

## 2021-11-03 ENCOUNTER — Other Ambulatory Visit: Payer: Self-pay

## 2021-11-03 ENCOUNTER — Encounter: Payer: Self-pay | Admitting: Allergy

## 2021-11-03 ENCOUNTER — Ambulatory Visit: Payer: Federal, State, Local not specified - PPO | Admitting: Allergy

## 2021-11-03 VITALS — BP 130/80 | HR 54 | Resp 16

## 2021-11-03 DIAGNOSIS — M9902 Segmental and somatic dysfunction of thoracic region: Secondary | ICD-10-CM | POA: Diagnosis not present

## 2021-11-03 DIAGNOSIS — J4550 Severe persistent asthma, uncomplicated: Secondary | ICD-10-CM | POA: Diagnosis not present

## 2021-11-03 DIAGNOSIS — J3089 Other allergic rhinitis: Secondary | ICD-10-CM

## 2021-11-03 DIAGNOSIS — L2089 Other atopic dermatitis: Secondary | ICD-10-CM

## 2021-11-03 DIAGNOSIS — R768 Other specified abnormal immunological findings in serum: Secondary | ICD-10-CM

## 2021-11-03 DIAGNOSIS — M5386 Other specified dorsopathies, lumbar region: Secondary | ICD-10-CM | POA: Diagnosis not present

## 2021-11-03 DIAGNOSIS — M5431 Sciatica, right side: Secondary | ICD-10-CM | POA: Diagnosis not present

## 2021-11-03 DIAGNOSIS — T7800XD Anaphylactic reaction due to unspecified food, subsequent encounter: Secondary | ICD-10-CM

## 2021-11-03 DIAGNOSIS — J479 Bronchiectasis, uncomplicated: Secondary | ICD-10-CM | POA: Diagnosis not present

## 2021-11-03 DIAGNOSIS — M9903 Segmental and somatic dysfunction of lumbar region: Secondary | ICD-10-CM | POA: Diagnosis not present

## 2021-11-03 DIAGNOSIS — J4551 Severe persistent asthma with (acute) exacerbation: Secondary | ICD-10-CM

## 2021-11-03 NOTE — Patient Instructions (Addendum)
I wiill review the rest of your bloodwork when they are back.   Decrease your smoking.   Asthma:  Start prednisone taper. Prednisone '10mg'$  tablets - take 2 tablets for 4 days then 1 tablet on day 5.   Take omeprazole while on steroids.  Start Berna Bue - will start PA process.  Tammy our biologics coordinator will be in touch with you.  Daily controller medication(s): continue Breztri 2 puffs twice a day with spacer and rinse mouth afterwards. During upper respiratory infections/asthma flares:  Start budesonide 0.'5mg'$  nebulizer twice a day for 1-2 weeks until your breathing symptoms return to baseline.  Pretreat with albuterol 2 puffs or albuterol nebulizer.  If you need to use your albuterol nebulizer machine back to back within 15-30 minutes with no relief then please go to the ER/urgent care for further evaluation.  May use albuterol rescue inhaler 2 puffs or nebulizer every 4 to 6 hours as needed for shortness of breath, chest tightness, coughing, and wheezing. May use albuterol rescue inhaler 2 puffs 5 to 15 minutes prior to strenuous physical activities. Monitor frequency of use.  Asthma control goals:  Full participation in all desired activities (may need albuterol before activity) Albuterol use two times or less a week on average (not counting use with activity) Cough interfering with sleep two times or less a month Oral steroids no more than once a year No hospitalizations   Infections Keep track of infections and antibiotics use.  Rhinitis:  Use over the counter antihistamines such as Zyrtec (cetirizine), Claritin (loratadine), Allegra (fexofenadine), or Xyzal (levocetirizine) daily as needed. May take twice a day during allergy flares. May switch antihistamines every few months. Continue Singulair (montelukast) '10mg'$  daily at night. Nasal saline spray (i.e., Simply Saline) or nasal saline lavage (i.e., NeilMed) is recommended as needed and prior to medicated nasal  sprays. Continue environmental control measures.   Food allergies Continue strict avoidance of peanuts, tree nuts, shellfish, mollusks. For mild symptoms you can take over the counter antihistamines such as Benadryl and monitor symptoms closely. If symptoms worsen or if you have severe symptoms including breathing issues, throat closure, significant swelling, whole body hives, severe diarrhea and vomiting, lightheadedness then inject epinephrine and seek immediate medical care afterwards. Emergency action plan in place.   Eczema: Use epicerum twice a day as needed. Continue proper skin care.   Follow up in 2 months or sooner if needed.

## 2021-11-04 NOTE — Assessment & Plan Note (Signed)
Past history - Ige level over 12K. Denies skin abscesses, retained primary teeth, fractures, scoliosis, GI issues.  Interim history - awaiting some bloodwork results.   Discussed with patient at length that this is most likely due to her eczema.

## 2021-11-04 NOTE — Assessment & Plan Note (Signed)
Past history - 06/25/2021 CXR showed minimal central and infrahilar bronchiectasis appears stable since prior examination. Denies any pneumonias, bronchitis or frequent infections. 2023 bloodwork normal IgG, low pneumococcal titers. Got prevnar 20 with good response. Normal alpha-1 antitrypsin level.  Interim history - No additional antibiotics.   Keep track of infections and antibiotics use.

## 2021-11-04 NOTE — Assessment & Plan Note (Signed)
Past history - Peanuts caused hives and wheezing. Avoiding shellfish and tree nuts due to positive testing in the past. Tolerates shrimp. 2023 bloodwork positive to tree nuts, peanuts, shellfish, mollusks.  More likely to be anaphylactic to hazelnuts, walnuts, cashews, [place] nuts, peanuts. Interim history - no reactions.   Continue strict avoidance of peanuts, tree nuts, shellfish, mollusks.  For mild symptoms you can take over the counter antihistamines such as Benadryl and monitor symptoms closely. If symptoms worsen or if you have severe symptoms including breathing issues, throat closure, significant swelling, whole body hives, severe diarrhea and vomiting, lightheadedness then inject epinephrine and seek immediate medical care afterwards.  Emergency action plan in place.

## 2021-11-04 NOTE — Assessment & Plan Note (Signed)
Past history - 2023 Environmental allergy panel was positive to dust mites, cat, dog, grass, mold, trees, mouse, ragweed, weed pollen but IgE was very high (12K). Does not like Ryaltris. Interim history - stable. Not sure if adding on Singulair is helping.  Use over the counter antihistamines such as Zyrtec (cetirizine), Claritin (loratadine), Allegra (fexofenadine), or Xyzal (levocetirizine) daily as needed. May take twice a day during allergy flares. May switch antihistamines every few months.  Continue Singulair (montelukast) '10mg'$  daily at night.  Nasal saline spray (i.e., Simply Saline) or nasal saline lavage (i.e., NeilMed) is recommended as needed and prior to medicated nasal sprays.  Continue environmental control measures.

## 2021-11-04 NOTE — Assessment & Plan Note (Signed)
Past history - Diagnosed with asthma over 20+ years ago. Not on any maintenance inhalers - some caused thrush in the past. Tried Advair, Wixela and Symbicort.  5 courses of prednisone this year. Current smoker. Was on Dupixent for eczema but stopped due to some side effects. 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 Clinically feeling improved. 06/25/2021 CXR no acute process. Interim history - still smoking, been on prednisone '5mg'$  daily since last OV and had 1 additional burst of prednisone. She still has Breztri samples and that's why she did not pick up her Rx at the pharmacy.   Patient would benefit from adding on biologics due to her frequent prednisone bursts and currently on daily prednisone '5mg'$ .   Today's spirometry showed some restriction with 29% improvement in FEV1 post bronchodilator treatment. Clinically feeling unchanged but moving more air on exam.  . Discussed smoking cessation.  . Start prednisone taper. Prednisone '10mg'$  tablets - take 2 tablets for 4 days then 1 tablet on day 5.  . Take omeprazole while on steroids.  . Start Berna Bue - will start PA process.  o Tammy our biologics coordinator will be in touch with you.  . Daily controller medication(s): continue Breztri 2 puffs twice a day with spacer and rinse mouth afterwards. . During upper respiratory infections/asthma flares:  o Start budesonide 0.'5mg'$  nebulizer twice a day for 1-2 weeks until your breathing symptoms return to baseline.  o Pretreat with albuterol 2 puffs or albuterol nebulizer.  o If you need to use your albuterol nebulizer machine back to back within 15-30 minutes with no relief then please go to the ER/urgent care for further evaluation.  . May use albuterol rescue inhaler 2 puffs or nebulizer every 4 to 6 hours as needed for shortness of breath, chest tightness, coughing, and wheezing. May use albuterol rescue inhaler 2 puffs  5 to 15 minutes prior to strenuous physical activities. Monitor frequency of use.  . Get spirometry at next visit.

## 2021-11-04 NOTE — Assessment & Plan Note (Signed)
Past history - Saw dermatology and was on [place] in the past. Stopped as it caused her skin to feel hypersensitive. Interim history - cleared up since on prednisone daily. . Use epicerum twice a day as needed. . Continue proper skin care.

## 2021-11-06 LAB — ASPERGILLUS IGE PANEL
A. Amstel/Glaucu Class Interp: 3
A. Flavus Class Interp: 2
A. Fumigatus Class Interp: 4
A. Nidulans Class Interp: 1
A. Niger Class Interp: 3
A. Versicolor Class Interp: 3
Aspergillus amstel/glaucu IgE*: 7.26 kU/L — ABNORMAL HIGH (ref <0.35)
Aspergillus flavus IgE: 2.69 kU/L — ABNORMAL HIGH (ref <0.35)
Aspergillus fumigatus IgE: 20.8 kU/L — ABNORMAL HIGH (ref <0.35)
Aspergillus nidulans IgE: 0.35 kU/L — ABNORMAL HIGH (ref <0.35)
Aspergillus niger IgE: 4.75 kU/L — ABNORMAL HIGH (ref <0.35)
Aspergillus versicolor IgE: 5.35 kU/L — ABNORMAL HIGH (ref <0.35)

## 2021-11-06 LAB — HIV ANTIBODY (ROUTINE TESTING W REFLEX): HIV Screen 4th Generation wRfx: NONREACTIVE

## 2021-11-06 LAB — HIES: STAT3

## 2021-11-10 ENCOUNTER — Encounter: Payer: Self-pay | Admitting: Allergy

## 2021-11-10 DIAGNOSIS — J4540 Moderate persistent asthma, uncomplicated: Secondary | ICD-10-CM | POA: Diagnosis not present

## 2021-11-10 DIAGNOSIS — L209 Atopic dermatitis, unspecified: Secondary | ICD-10-CM | POA: Diagnosis not present

## 2021-11-10 DIAGNOSIS — Z79899 Other long term (current) drug therapy: Secondary | ICD-10-CM | POA: Diagnosis not present

## 2021-11-10 DIAGNOSIS — K2100 Gastro-esophageal reflux disease with esophagitis, without bleeding: Secondary | ICD-10-CM | POA: Diagnosis not present

## 2021-11-11 DIAGNOSIS — M9902 Segmental and somatic dysfunction of thoracic region: Secondary | ICD-10-CM | POA: Diagnosis not present

## 2021-11-11 DIAGNOSIS — M5386 Other specified dorsopathies, lumbar region: Secondary | ICD-10-CM | POA: Diagnosis not present

## 2021-11-11 DIAGNOSIS — M5431 Sciatica, right side: Secondary | ICD-10-CM | POA: Diagnosis not present

## 2021-11-11 DIAGNOSIS — M9903 Segmental and somatic dysfunction of lumbar region: Secondary | ICD-10-CM | POA: Diagnosis not present

## 2021-11-11 NOTE — Progress Notes (Signed)
RE: Barbara Espinoza MRN: [id number] DOB: January 18, 1971 Date of Telemedicine Visit: 11/12/2021  Referring provider: Vincente Liberty, MD Primary care provider: Vincente Liberty, MD  Chief Complaint: Asthma (Finished the prednisone for 5 days per Dr. Maudie Mercury and was given another week of prednisone 11/10/21 from her PCP for another week. ) and Results (Calling to go over lab results )   Telemedicine Follow Up Visit via Telephone: I connected with Sibley Rolison for a follow up on 11/12/21 by telephone and verified that I am speaking with the correct person using two identifiers.   I discussed the limitations, risks, security and privacy concerns of performing an evaluation and management service by telephone and the availability of in person appointments. I also discussed with the patient that there may be a patient responsible charge related to this service. The patient expressed understanding and agreed to proceed.  Patient is at home/work. Provider is at the office.  Visit start time: 10:12AM Visit end time: 10:30AM Insurance consent/check in by: front desk Medical consent and medical assistant/nurse: Diandra D.  History of Present Illness: She is a 51 y.o. female, who is being followed for asthma, allergic rhinitis, food allergy, atopic dermatitis, elevated IgE level. Her previous allergy office visit was on 11/03/2021 with Dr. Maudie Mercury. Today is a  visit to review blood work results .  Asthma  Finished prednisone taper but she is still wheezing. Dr. Katherine Roan prescribed dexa which seems to work better than prednisone.  No coughing at all and has minimal mucous. No CT chest in the past that can think of.  Berna Bue got approved.  Reviewed lab results - negative HIV and genetic testing does not seem to be positive for HIES. Mold panel was positive for IgE but IgG precipitants were negative.  Assessment and Plan: Annalyssa is a 51 y.o. female with: Not well controlled asthma with acute  exacerbation Past history - Diagnosed with asthma over 20+ years ago. Not on any maintenance inhalers - some caused thrush in the past. Tried Advair, Wixela and Symbicort.  5 courses of prednisone this year. Current smoker. Was on Dupixent for eczema but stopped due to some side effects. 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 Clinically feeling improved. 06/25/2021 CXR no acute process. Interim history - still wheezing and now on dexa burst. Fasenra approved. Reviewed labs with patient - No HIES, negative HIV panel, positive mold IgE but negative IgG precipitants. Finish dexamethasone as prescribed by Dr. Katherine Roan. Get CT chest. Will make additional recommendations based on results.  Take omeprazole while on steroids.  Start Fasenra - sample dose given today.  Daily controller medication(s): continue Breztri 2 puffs twice a day with spacer and rinse mouth afterwards. During upper respiratory infections/asthma flares:  Start budesonide 0.'5mg'$  nebulizer twice a day for 1-2 weeks until your breathing symptoms return to baseline.  Pretreat with albuterol 2 puffs or albuterol nebulizer.  If you need to use your albuterol nebulizer machine back to back within 15-30 minutes with no relief then please go to the ER/urgent care for further evaluation.  May use albuterol rescue inhaler 2 puffs or nebulizer every 4 to 6 hours as needed for shortness of breath, chest tightness, coughing, and wheezing. May use albuterol rescue inhaler 2 puffs 5 to 15 minutes prior to strenuous physical activities. Monitor frequency of use.  Get spirometry at next visit.  Bronchiectasis without complication [facility]) Past history - 06/25/2021 CXR showed minimal central and  infrahilar bronchiectasis appears stable since prior examination. Denies any pneumonias, bronchitis or frequent infections. 2023 bloodwork normal IgG, low pneumococcal titers.  Got prevnar 20 with good response. Normal alpha-1 antitrypsin level.  Keep track of infections and antibiotics use. Get CT chest.  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 Denies skin abscesses, retained primary teeth, fractures, scoliosis, GI issues.  Interim history - negative genetic testing for HIES. Discussed with patient at length that this is most likely due to her eczema.  Other allergic rhinitis Past history - 2023 Environmental allergy panel was positive to dust mites, cat, dog, grass, mold, trees, mouse, ragweed, weed pollen but IgE was very high (12K). Does not like Ryaltris. Use over the counter antihistamines such as Zyrtec (cetirizine), Claritin (loratadine), Allegra (fexofenadine), or Xyzal (levocetirizine) daily as needed. May take twice a day during allergy flares. May switch antihistamines every few months. Continue Singulair (montelukast) '10mg'$  daily at night. Nasal saline spray (i.e., Simply Saline) or nasal saline lavage (i.e., NeilMed) is recommended as needed and prior to medicated nasal sprays. Continue environmental control measures.   Other atopic dermatitis Past history - Saw dermatology and was on [place] in the past. Stopped as it caused her skin to feel hypersensitive. Use epicerum twice a day as needed. Continue proper skin care.   Anaphylactic reaction due to food, subsequent encounter Past history - Peanuts caused hives and wheezing. Avoiding shellfish and tree nuts due to positive testing in the past. Tolerates shrimp. 2023 bloodwork positive to tree nuts, peanuts, shellfish, mollusks.  More likely to be anaphylactic to hazelnuts, walnuts, cashews, [place] nuts, peanuts. Continue strict avoidance of peanuts, tree nuts, shellfish, mollusks. For mild symptoms you can take over the counter antihistamines such as Benadryl and monitor symptoms closely. If symptoms worsen or if you have severe symptoms including breathing issues, throat closure,  significant swelling, whole body hives, severe diarrhea and vomiting, lightheadedness then inject epinephrine and seek immediate medical care afterwards. Emergency action plan in place.   Return in about 2 months (around 01/12/2022).  No orders of the defined types were placed in this encounter.  Lab Orders  No laboratory test(s) ordered today    Diagnostics: None.  Medication List:  Current Outpatient Medications  Medication Sig Dispense Refill   albuterol (PROVENTIL HFA;VENTOLIN HFA) 108 (90 BASE) MCG/ACT inhaler Inhale 2 puffs into the lungs every 4 (four) hours as needed. Shortness of breath and wheezing      albuterol (PROVENTIL) (2.5 MG/3ML) 0.083% nebulizer solution Take 2.5 mg by nebulization every 6 (six) hours as needed. Shortness of breath and wheezing      Budeson-Glycopyrrol-Formoterol (BREZTRI AEROSPHERE) 160-9-4.8 MCG/ACT AERO Inhale 2 puffs into the lungs in the morning and at bedtime. with spacer and rinse mouth afterwards. 10.7 g 3   budesonide (PULMICORT) 0.5 MG/2ML nebulizer solution INHALE 2 MLS BY NEBULIZATION IN THE MORNING AND AT BEDTIME. 360 mL 0   EPINEPHrine 0.3 mg/0.3 mL IJ SOAJ injection Inject 0.3 mg into the muscle as needed for anaphylaxis. 1 each 2   escitalopram (LEXAPRO) 5 MG tablet Take 1 tablet by mouth daily.     montelukast (SINGULAIR) 10 MG tablet Take 1 tablet (10 mg total) by mouth at bedtime. 30 tablet 5   Multiple Vitamins-Minerals (HAIR SKIN AND NAILS FORMULA) TABS Take by mouth.     omeprazole (PRILOSEC) 20 MG capsule Take 1 capsule (20 mg total) by mouth daily. 30 capsule 1   triamcinolone ointment (KENALOG) 0.1 % Apply 1  application topically daily. Use as a moisturizer. 453 g 3   No current facility-administered medications for this visit.   Allergies: Allergies  Allergen Reactions   Peanut-Containing Drug Products Hives    "NUTS" Reaction: wheezing   Shellfish Allergy Hives    Reaction: wheezing   I reviewed her past medical  history, social history, family history, and environmental history and no significant changes have been reported from her previous visit.  Review of Systems  Constitutional:  Negative for appetite change, chills, fever and unexpected weight change.  HENT:  Negative for congestion and rhinorrhea.   Respiratory:  Positive for cough, chest tightness, shortness of breath and wheezing.   Cardiovascular:  Negative for chest pain.  Gastrointestinal:  Negative for abdominal pain.  Genitourinary:  Negative for difficulty urinating.  Allergic/Immunologic: Positive for environmental allergies and food allergies.  Neurological:  Negative for headaches.    Objective: Physical Exam Not obtained as encounter was done via telephone.   Previous notes and tests were reviewed.  I discussed the assessment and treatment plan with the patient. The patient was provided an opportunity to ask questions and all were answered. The patient agreed with the plan and demonstrated an understanding of the instructions. After visit summary/patient instructions available via mychart.   The patient was advised to call back or seek an in-person evaluation if the symptoms worsen or if the condition fails to improve as anticipated.  I provided 18 minutes of non-face-to-face time during this encounter.  It was my pleasure to participate in [place] care today. Please feel free to contact me with any questions or concerns.   Sincerely,  Rexene Alberts, DO Allergy & Immunology  [facility] office: [place] office: [phone]

## 2021-11-12 ENCOUNTER — Encounter: Payer: Self-pay | Admitting: Allergy

## 2021-11-12 ENCOUNTER — Telehealth: Payer: Self-pay | Admitting: *Deleted

## 2021-11-12 ENCOUNTER — Ambulatory Visit (INDEPENDENT_AMBULATORY_CARE_PROVIDER_SITE_OTHER): Payer: Federal, State, Local not specified - PPO

## 2021-11-12 ENCOUNTER — Other Ambulatory Visit: Payer: Self-pay

## 2021-11-12 ENCOUNTER — Ambulatory Visit: Payer: Federal, State, Local not specified - PPO | Admitting: Allergy

## 2021-11-12 DIAGNOSIS — J479 Bronchiectasis, uncomplicated: Secondary | ICD-10-CM

## 2021-11-12 DIAGNOSIS — T7800XD Anaphylactic reaction due to unspecified food, subsequent encounter: Secondary | ICD-10-CM

## 2021-11-12 DIAGNOSIS — L2089 Other atopic dermatitis: Secondary | ICD-10-CM

## 2021-11-12 DIAGNOSIS — J45901 Unspecified asthma with (acute) exacerbation: Secondary | ICD-10-CM

## 2021-11-12 DIAGNOSIS — J3089 Other allergic rhinitis: Secondary | ICD-10-CM

## 2021-11-12 DIAGNOSIS — J4551 Severe persistent asthma with (acute) exacerbation: Secondary | ICD-10-CM

## 2021-11-12 DIAGNOSIS — R768 Other specified abnormal immunological findings in serum: Secondary | ICD-10-CM

## 2021-11-12 DIAGNOSIS — J4550 Severe persistent asthma, uncomplicated: Secondary | ICD-10-CM

## 2021-11-12 MED ORDER — BENRALIZUMAB 30 MG/ML ~~LOC~~ SOSY
30.0000 mg | PREFILLED_SYRINGE | Freq: Once | SUBCUTANEOUS | Status: AC
  Administered 2021-11-12: 30 mg via SUBCUTANEOUS

## 2021-11-12 NOTE — Assessment & Plan Note (Addendum)
Past history - Diagnosed with asthma over 20+ years ago. Not on any maintenance inhalers - some caused thrush in the past. Tried Advair, Wixela and Symbicort.  5 courses of prednisone this year. Current smoker. Was on Dupixent for eczema but stopped due to some side effects. 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 Clinically feeling improved. 06/25/2021 CXR no acute process. Interim history - still wheezing and now on dexa burst. Fasenra approved.  Reviewed labs with patient - No HIES, negative HIV panel, positive mold IgE but negative IgG precipitants.  Finish dexamethasone as prescribed by Dr. Katherine Roan.  Get CT chest.  Will make additional recommendations based on results.  . Take omeprazole while on steroids.  . Start Fasenra - sample dose given today.  . Daily controller medication(s): continue Breztri 2 puffs twice a day with spacer and rinse mouth afterwards. . During upper respiratory infections/asthma flares:  o Start budesonide 0.'5mg'$  nebulizer twice a day for 1-2 weeks until your breathing symptoms return to baseline.  o Pretreat with albuterol 2 puffs or albuterol nebulizer.  o If you need to use your albuterol nebulizer machine back to back within 15-30 minutes with no relief then please go to the ER/urgent care for further evaluation.  . May use albuterol rescue inhaler 2 puffs or nebulizer every 4 to 6 hours as needed for shortness of breath, chest tightness, coughing, and wheezing. May use albuterol rescue inhaler 2 puffs 5 to 15 minutes prior to strenuous physical activities. Monitor frequency of use.  . Get spirometry at next visit.

## 2021-11-12 NOTE — Telephone Encounter (Signed)
-----   Message from Garnet Sierras, DO sent at 11/03/2021  5:01 PM EDT ----- Lynelle Smoke, can you look into the [facility] again. She has been using breztri samples and she said she picked up 1 inhaler and is going to pick up another one this mont. However she is having flares and is taking prednisone daily. Thank you.

## 2021-11-12 NOTE — Assessment & Plan Note (Signed)
Past history - Peanuts caused hives and wheezing. Avoiding shellfish and tree nuts due to positive testing in the past. Tolerates shrimp. 2023 bloodwork positive to tree nuts, peanuts, shellfish, mollusks.  More likely to be anaphylactic to hazelnuts, walnuts, cashews, [place] nuts, peanuts. Continue strict avoidance of peanuts, tree nuts, shellfish, mollusks. For mild symptoms you can take over the counter antihistamines such as Benadryl and monitor symptoms closely. If symptoms worsen or if you have severe symptoms including breathing issues, throat closure, significant swelling, whole body hives, severe diarrhea and vomiting, lightheadedness then inject epinephrine and seek immediate medical care afterwards. Emergency action plan in place.  

## 2021-11-12 NOTE — Telephone Encounter (Signed)
Called patient to advised approval, copay card and submit to [employer] for [facility] '30mg'$  autoinjector. Instructed patient on delivery, storage and contact office for appt for initial injection

## 2021-11-12 NOTE — Telephone Encounter (Signed)
Noted. I'm going to start with sample Fasenra dose this week.

## 2021-11-12 NOTE — Assessment & Plan Note (Addendum)
Past history - Ige level over 12K. Denies skin abscesses, retained primary teeth, fractures, scoliosis, GI issues.  Interim history - negative genetic testing for HIES.  Discussed with patient at length that this is most likely due to her eczema.

## 2021-11-12 NOTE — Patient Instructions (Addendum)
Asthma:  Finish dexamethasone as prescribed by Dr. Katherine Roan. Get CT chest. Take omeprazole while on steroids.  Start Fasenra - sample dose given today.  Daily controller medication(s): continue Breztri 2 puffs twice a day with spacer and rinse mouth afterwards. During upper respiratory infections/asthma flares:  Start budesonide 0.'5mg'$  nebulizer twice a day for 1-2 weeks until your breathing symptoms return to baseline.  Pretreat with albuterol 2 puffs or albuterol nebulizer.  If you need to use your albuterol nebulizer machine back to back within 15-30 minutes with no relief then please go to the ER/urgent care for further evaluation.  May use albuterol rescue inhaler 2 puffs or nebulizer every 4 to 6 hours as needed for shortness of breath, chest tightness, coughing, and wheezing. May use albuterol rescue inhaler 2 puffs 5 to 15 minutes prior to strenuous physical activities. Monitor frequency of use.  Asthma control goals:  Full participation in all desired activities (may need albuterol before activity) Albuterol use two times or less a week on average (not counting use with activity) Cough interfering with sleep two times or less a month Oral steroids no more than once a year No hospitalizations   Infections Keep track of infections and antibiotics use.  Rhinitis:  Use over the counter antihistamines such as Zyrtec (cetirizine), Claritin (loratadine), Allegra (fexofenadine), or Xyzal (levocetirizine) daily as needed. May take twice a day during allergy flares. May switch antihistamines every few months. Continue Singulair (montelukast) '10mg'$  daily at night. Nasal saline spray (i.e., Simply Saline) or nasal saline lavage (i.e., NeilMed) is recommended as needed and prior to medicated nasal sprays. Continue environmental control measures.   Food allergies Continue strict avoidance of peanuts, tree nuts, shellfish, mollusks. For mild symptoms you can take over the counter  antihistamines such as Benadryl and monitor symptoms closely. If symptoms worsen or if you have severe symptoms including breathing issues, throat closure, significant swelling, whole body hives, severe diarrhea and vomiting, lightheadedness then inject epinephrine and seek immediate medical care afterwards. Emergency action plan in place.   Eczema: Use epicerum twice a day as needed. Continue proper skin care.   Follow up in 2 months or sooner if needed.

## 2021-11-20 MED ORDER — BENRALIZUMAB 30 MG/ML ~~LOC~~ SOSY
30.0000 mg | PREFILLED_SYRINGE | Freq: Once | SUBCUTANEOUS | Status: AC
  Administered 2021-11-12: 30 mg via SUBCUTANEOUS

## 2021-11-20 NOTE — Addendum Note (Signed)
Addended by: Carin Hock on: 11/20/2021 10:38 AM   Modules accepted: Orders

## 2021-12-01 ENCOUNTER — Encounter: Payer: Self-pay | Admitting: Allergy

## 2021-12-02 NOTE — Telephone Encounter (Signed)
Spoke with patient regarding the disability retirement.  Patient states it takes about 6-12 month to get approval.  Discussed that I don't fill out disability forms but if she needs any supporting documentation then I will provide them.  She will need to get PCP involved with the disability forms.   If she needs FMLA forms then I can fill those out.  She is going to call to get CT scan scheduled.  She got first Fasenra injection with no change in symptoms.  Has appointment with me in August.

## 2021-12-03 ENCOUNTER — Ambulatory Visit: Payer: Federal, State, Local not specified - PPO

## 2021-12-11 ENCOUNTER — Ambulatory Visit (HOSPITAL_COMMUNITY)
Admission: RE | Admit: 2021-12-11 | Discharge: 2021-12-11 | Disposition: A | Discharge status: Home / Self Care | Payer: Federal, State, Local not specified - PPO | Source: Ambulatory Visit | Attending: Allergy | Admitting: Allergy

## 2021-12-11 DIAGNOSIS — R918 Other nonspecific abnormal finding of lung field: Secondary | ICD-10-CM | POA: Diagnosis not present

## 2021-12-11 DIAGNOSIS — J479 Bronchiectasis, uncomplicated: Secondary | ICD-10-CM | POA: Insufficient documentation

## 2021-12-11 DIAGNOSIS — J929 Pleural plaque without asbestos: Secondary | ICD-10-CM | POA: Diagnosis not present

## 2021-12-11 DIAGNOSIS — J4551 Severe persistent asthma with (acute) exacerbation: Secondary | ICD-10-CM | POA: Diagnosis not present

## 2021-12-11 DIAGNOSIS — J471 Bronchiectasis with (acute) exacerbation: Secondary | ICD-10-CM | POA: Diagnosis not present

## 2021-12-14 DIAGNOSIS — L209 Atopic dermatitis, unspecified: Secondary | ICD-10-CM | POA: Diagnosis not present

## 2021-12-14 DIAGNOSIS — K2100 Gastro-esophageal reflux disease with esophagitis, without bleeding: Secondary | ICD-10-CM | POA: Diagnosis not present

## 2021-12-14 DIAGNOSIS — Z79899 Other long term (current) drug therapy: Secondary | ICD-10-CM | POA: Diagnosis not present

## 2021-12-14 DIAGNOSIS — J4540 Moderate persistent asthma, uncomplicated: Secondary | ICD-10-CM | POA: Diagnosis not present

## 2021-12-14 NOTE — Progress Notes (Signed)
Please fax CT test results to PCP Dr. Katherine Roan.  I already spoke with patient regarding the results and she has appointment with her PCP today at 3:30PM.

## 2021-12-16 ENCOUNTER — Other Ambulatory Visit (HOSPITAL_COMMUNITY): Payer: Self-pay | Admitting: Pulmonary Disease

## 2021-12-16 ENCOUNTER — Other Ambulatory Visit: Payer: Self-pay | Admitting: Pulmonary Disease

## 2021-12-16 DIAGNOSIS — K7689 Other specified diseases of liver: Secondary | ICD-10-CM

## 2021-12-16 DIAGNOSIS — K769 Liver disease, unspecified: Secondary | ICD-10-CM

## 2021-12-16 DIAGNOSIS — Z800 Family history of malignant neoplasm of digestive organs: Secondary | ICD-10-CM

## 2021-12-22 ENCOUNTER — Other Ambulatory Visit (HOSPITAL_COMMUNITY): Payer: Federal, State, Local not specified - PPO

## 2021-12-24 ENCOUNTER — Other Ambulatory Visit (HOSPITAL_COMMUNITY): Payer: Federal, State, Local not specified - PPO

## 2021-12-26 ENCOUNTER — Ambulatory Visit (HOSPITAL_COMMUNITY)
Admission: RE | Admit: 2021-12-26 | Discharge: 2021-12-26 | Disposition: A | Discharge status: Home / Self Care | Payer: Federal, State, Local not specified - PPO | Source: Ambulatory Visit | Attending: Pulmonary Disease | Admitting: Pulmonary Disease

## 2021-12-26 DIAGNOSIS — K7689 Other specified diseases of liver: Secondary | ICD-10-CM | POA: Insufficient documentation

## 2021-12-26 DIAGNOSIS — D1803 Hemangioma of intra-abdominal structures: Secondary | ICD-10-CM | POA: Diagnosis not present

## 2021-12-26 DIAGNOSIS — R160 Hepatomegaly, not elsewhere classified: Secondary | ICD-10-CM | POA: Diagnosis not present

## 2021-12-26 DIAGNOSIS — D3500 Benign neoplasm of unspecified adrenal gland: Secondary | ICD-10-CM | POA: Diagnosis not present

## 2021-12-26 DIAGNOSIS — Z800 Family history of malignant neoplasm of digestive organs: Secondary | ICD-10-CM | POA: Diagnosis not present

## 2021-12-26 DIAGNOSIS — K449 Diaphragmatic hernia without obstruction or gangrene: Secondary | ICD-10-CM | POA: Diagnosis not present

## 2021-12-26 MED ORDER — GADOBUTROL 1 MMOL/ML IV SOLN
9.0000 mL | Freq: Once | INTRAVENOUS | Status: AC | PRN
  Administered 2021-12-26: 9 mL via INTRAVENOUS

## 2022-01-11 NOTE — Progress Notes (Unsigned)
Follow Up Note  RE: [name] MRN: [id number] DOB: 08-13-1970 Date of Office Visit: 01/12/2022  Referring provider: Vincente Liberty, MD Primary care provider: Vincente Liberty, MD  Chief Complaint: No chief complaint on file.  History of Present Illness: I had the pleasure of seeing Barbara Espinoza for a follow up visit at the [facility] [place] on 01/11/2022. She is a 51 y.o. female, who is being followed for asthma on Fasenra, bronchiectasis, elevated IgE, allergic rhinitis, atopic dermatitis, food allergy. Her previous allergy office visit was on 11/12/2021 with Dr. Maudie Mercury via telemedicine. Today is a regular follow up visit.  Not well controlled asthma with acute exacerbation Past history - Diagnosed with asthma over 20+ years ago. Not on any maintenance inhalers - some caused thrush in the past. Tried Advair, Wixela and Symbicort.  5 courses of prednisone this year. Current smoker. Was on Dupixent for eczema but stopped due to some side effects. 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 Clinically feeling improved. 06/25/2021 CXR no acute process. Interim history - still wheezing and now on dexa burst. Fasenra approved. Reviewed labs with patient - No HIES, negative HIV panel, positive mold IgE but negative IgG precipitants. Finish dexamethasone as prescribed by Dr. Katherine Roan. Get CT chest. Will make additional recommendations based on results.  Take omeprazole while on steroids.  Start Fasenra - sample dose given today.  Daily controller medication(s): continue Breztri 2 puffs twice a day with spacer and rinse mouth afterwards. During upper respiratory infections/asthma flares:  Start budesonide 0.'5mg'$  nebulizer twice a day for 1-2 weeks until your breathing symptoms return to baseline.  Pretreat with albuterol 2 puffs or albuterol nebulizer.  If you need to use your albuterol  nebulizer machine back to back within 15-30 minutes with no relief then please go to the ER/urgent care for further evaluation.  May use albuterol rescue inhaler 2 puffs or nebulizer every 4 to 6 hours as needed for shortness of breath, chest tightness, coughing, and wheezing. May use albuterol rescue inhaler 2 puffs 5 to 15 minutes prior to strenuous physical activities. Monitor frequency of use.  Get spirometry at next visit.   Bronchiectasis without complication ([place]) Past history - 06/25/2021 CXR showed minimal central and infrahilar bronchiectasis appears stable since prior examination. Denies any pneumonias, bronchitis or frequent infections. 2023 bloodwork normal IgG, low pneumococcal titers. Got prevnar 20 with good response. Normal alpha-1 antitrypsin level.  Keep track of infections and antibiotics use. Get CT chest.   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 Denies skin abscesses, retained primary teeth, fractures, scoliosis, GI issues.  Interim history - negative genetic testing for HIES. Discussed with patient at length that this is most likely due to her eczema.   Other allergic rhinitis Past history - 2023 Environmental allergy panel was positive to dust mites, cat, dog, grass, mold, trees, mouse, ragweed, weed pollen but IgE was very high (12K). Does not like Ryaltris. Use over the counter antihistamines such as Zyrtec (cetirizine), Claritin (loratadine), Allegra (fexofenadine), or Xyzal (levocetirizine) daily as needed. May take twice a day during allergy flares. May switch antihistamines every few months. Continue Singulair (montelukast) '10mg'$  daily at night. Nasal saline spray (i.e., Simply Saline) or nasal saline lavage (i.e., NeilMed) is recommended as needed and prior to medicated nasal sprays. Continue environmental control measures.    Other atopic dermatitis Past history - Saw dermatology and was on Dupixent  in the past. Stopped as it caused her skin to feel  hypersensitive. Use epicerum twice a day as needed. Continue proper skin care.    Anaphylactic reaction due to food, subsequent encounter Past history - Peanuts caused hives and wheezing. Avoiding shellfish and tree nuts due to positive testing in the past. Tolerates shrimp. 2023 bloodwork positive to tree nuts, peanuts, shellfish, mollusks.  More likely to be anaphylactic to hazelnuts, walnuts, cashews, [place] nuts, peanuts. Continue strict avoidance of peanuts, tree nuts, shellfish, mollusks. For mild symptoms you can take over the counter antihistamines such as Benadryl and monitor symptoms closely. If symptoms worsen or if you have severe symptoms including breathing issues, throat closure, significant swelling, whole body hives, severe diarrhea and vomiting, lightheadedness then inject epinephrine and seek immediate medical care afterwards. Emergency action plan in place.    Return in about 2 months (around 01/12/2022).  Assessment and Plan: Barbara Espinoza is a 51 y.o. female with: No problem-specific Assessment & Plan notes found for this encounter.  No follow-ups on file.  No orders of the defined types were placed in this encounter.  Lab Orders  No laboratory test(s) ordered today    Diagnostics: Spirometry:  Tracings reviewed. Her effort: {Blank single:19197::"Good reproducible efforts.","It was hard to get consistent efforts and there is a question as to whether this reflects a maximal maneuver.","Poor effort, data can not be interpreted."} FVC: ***L FEV1: ***L, ***% predicted FEV1/FVC ratio: ***% Interpretation: {Blank single:19197::"Spirometry consistent with mild obstructive disease","Spirometry consistent with moderate obstructive disease","Spirometry consistent with severe obstructive disease","Spirometry consistent with possible restrictive disease","Spirometry consistent with mixed obstructive and restrictive disease","Spirometry uninterpretable due to technique","Spirometry  consistent with normal pattern","No overt abnormalities noted given today's efforts"}.  Please see scanned spirometry results for details.  Skin Testing: {Blank single:19197::"Select foods","Environmental allergy panel","Environmental allergy panel and select foods","Food allergy panel","None","Deferred due to recent antihistamines use"}. *** Results discussed with patient/family.   Medication List:  Current Outpatient Medications  Medication Sig Dispense Refill   albuterol (PROVENTIL HFA;VENTOLIN HFA) 108 (90 BASE) MCG/ACT inhaler Inhale 2 puffs into the lungs every 4 (four) hours as needed. Shortness of breath and wheezing      albuterol (PROVENTIL) (2.5 MG/3ML) 0.083% nebulizer solution Take 2.5 mg by nebulization every 6 (six) hours as needed. Shortness of breath and wheezing      Budeson-Glycopyrrol-Formoterol (BREZTRI AEROSPHERE) 160-9-4.8 MCG/ACT AERO Inhale 2 puffs into the lungs in the morning and at bedtime. with spacer and rinse mouth afterwards. 10.7 g 3   budesonide (PULMICORT) 0.5 MG/2ML nebulizer solution INHALE 2 MLS BY NEBULIZATION IN THE MORNING AND AT BEDTIME. 360 mL 0   EPINEPHrine 0.3 mg/0.3 mL IJ SOAJ injection Inject 0.3 mg into the muscle as needed for anaphylaxis. 1 each 2   escitalopram (LEXAPRO) 5 MG tablet Take 1 tablet by mouth daily.     montelukast (SINGULAIR) 10 MG tablet Take 1 tablet (10 mg total) by mouth at bedtime. 30 tablet 5   Multiple Vitamins-Minerals (HAIR SKIN AND NAILS FORMULA) TABS Take by mouth.     omeprazole (PRILOSEC) 20 MG capsule Take 1 capsule (20 mg total) by mouth daily. 30 capsule 1   triamcinolone ointment (KENALOG) 0.1 % Apply 1 application topically daily. Use as a moisturizer. 453 g 3   No current facility-administered medications for this visit.   Allergies: Allergies  Allergen Reactions   Peanut-Containing Drug Products Hives    "NUTS" Reaction: wheezing   Shellfish Allergy Hives    Reaction: wheezing   I reviewed  her past  medical history, social history, family history, and environmental history and no significant changes have been reported from her previous visit.  Review of Systems  Constitutional:  Negative for appetite change, chills, fever and unexpected weight change.  HENT:  Negative for congestion and rhinorrhea.   Respiratory:  Positive for cough, chest tightness, shortness of breath and wheezing.   Cardiovascular:  Negative for chest pain.  Gastrointestinal:  Negative for abdominal pain.  Genitourinary:  Negative for difficulty urinating.  Allergic/Immunologic: Positive for environmental allergies and food allergies.  Neurological:  Negative for headaches.    Objective: There were no vitals taken for this visit. There is no height or weight on file to calculate BMI. Physical Exam Vitals and nursing note reviewed.  Constitutional:      Appearance: Normal appearance. She is well-developed.  HENT:     Head: Normocephalic and atraumatic.     Right Ear: Tympanic membrane and external ear normal.     Left Ear: Tympanic membrane and external ear normal.     Nose: Nose normal.     Mouth/Throat:     Mouth: Mucous membranes are moist.     Pharynx: Oropharynx is clear.  Eyes:     Conjunctiva/sclera: Conjunctivae normal.  Cardiovascular:     Rate and Rhythm: Normal rate and regular rhythm.     Heart sounds: Normal heart sounds. No murmur heard. Pulmonary:     Effort: Pulmonary effort is normal.     Breath sounds: Normal breath sounds. No wheezing, rhonchi or rales.  Musculoskeletal:     Cervical back: Neck supple.  Skin:    General: Skin is warm.     Findings: No rash.  Neurological:     Mental Status: She is alert and oriented to person, place, and time.  Psychiatric:        Behavior: Behavior normal.    Previous notes and tests were reviewed. The plan was reviewed with the patient/family, and all questions/concerned were addressed.  It was my pleasure to see Cyril today and  participate in her care. Please feel free to contact me with any questions or concerns.  Sincerely,  Rexene Alberts, DO Allergy & Immunology  [facility] office: [place] office: [phone]

## 2022-01-12 ENCOUNTER — Ambulatory Visit: Payer: Federal, State, Local not specified - PPO | Admitting: Allergy

## 2022-01-12 ENCOUNTER — Telehealth: Payer: Self-pay | Admitting: *Deleted

## 2022-01-12 ENCOUNTER — Encounter: Payer: Self-pay | Admitting: Allergy

## 2022-01-12 VITALS — BP 124/74 | HR 64 | Resp 18

## 2022-01-12 DIAGNOSIS — J4551 Severe persistent asthma with (acute) exacerbation: Secondary | ICD-10-CM | POA: Diagnosis not present

## 2022-01-12 DIAGNOSIS — B999 Unspecified infectious disease: Secondary | ICD-10-CM

## 2022-01-12 DIAGNOSIS — L2089 Other atopic dermatitis: Secondary | ICD-10-CM

## 2022-01-12 DIAGNOSIS — L301 Dyshidrosis [pompholyx]: Secondary | ICD-10-CM | POA: Diagnosis not present

## 2022-01-12 DIAGNOSIS — J3089 Other allergic rhinitis: Secondary | ICD-10-CM | POA: Diagnosis not present

## 2022-01-12 DIAGNOSIS — J479 Bronchiectasis, uncomplicated: Secondary | ICD-10-CM

## 2022-01-12 DIAGNOSIS — R768 Other specified abnormal immunological findings in serum: Secondary | ICD-10-CM

## 2022-01-12 DIAGNOSIS — T7800XD Anaphylactic reaction due to unspecified food, subsequent encounter: Secondary | ICD-10-CM

## 2022-01-12 MED ORDER — BREZTRI AEROSPHERE 160-9-4.8 MCG/ACT IN AERO: 2.0000 | INHALATION_SPRAY | Freq: Two times a day (BID) | RESPIRATORY_TRACT | 3 refills | Status: AC

## 2022-01-12 MED ORDER — EUCRISA 2 % EX OINT: 1.0000 | TOPICAL_OINTMENT | Freq: Two times a day (BID) | CUTANEOUS | 5 refills | Status: DC | PRN

## 2022-01-12 NOTE — Patient Instructions (Addendum)
Asthma:  Finish steroids.  Take omeprazole while on steroids.  Continue Fasenra injections.  Daily controller medication(s): continue Breztri 2 puffs twice a day with spacer and rinse mouth afterwards. Samples given.  During upper respiratory infections/asthma flares:  Start budesonide 0.'5mg'$  nebulizer twice a day for 1-2 weeks until your breathing symptoms return to baseline.  Pretreat with albuterol 2 puffs or albuterol nebulizer.  If you need to use your albuterol nebulizer machine back to back within 15-30 minutes with no relief then please go to the ER/urgent care for further evaluation.  May use albuterol rescue inhaler 2 puffs or nebulizer every 4 to 6 hours as needed for shortness of breath, chest tightness, coughing, and wheezing. May use albuterol rescue inhaler 2 puffs 5 to 15 minutes prior to strenuous physical activities. Monitor frequency of use.  Asthma control goals:  Full participation in all desired activities (may need albuterol before activity) Albuterol use two times or less a week on average (not counting use with activity) Cough interfering with sleep two times or less a month Oral steroids no more than once a year No hospitalizations   Infections Keep track of infections and antibiotics use.  Rhinitis:  Use over the counter antihistamines such as Zyrtec (cetirizine), Claritin (loratadine), Allegra (fexofenadine), or Xyzal (levocetirizine) daily as needed. May take twice a day during allergy flares. May switch antihistamines every few months. Continue Singulair (montelukast) '10mg'$  daily at night. Nasal saline spray (i.e., Simply Saline) or nasal saline lavage (i.e., NeilMed) is recommended as needed and prior to medicated nasal sprays. Continue environmental control measures.   Food allergies Continue strict avoidance of peanuts, tree nuts, shellfish, mollusks. For mild symptoms you can take over the counter antihistamines such as Benadryl and monitor symptoms  closely. If symptoms worsen or if you have severe symptoms including breathing issues, throat closure, significant swelling, whole body hives, severe diarrhea and vomiting, lightheadedness then inject epinephrine and seek immediate medical care afterwards. Emergency action plan in place.   Eczema: Use Eucrisa (crisaborole) 2% ointment twice a day on mild rash flares on the face and body. This is a non-steroid ointment. Sample given. If it burns, place the medication in the refrigerator.  Apply a thin layer of moisturizer and then apply the Eucrisa on top of it. Continue proper skin care.   Follow up in 3 months or sooner if needed.  Skin care recommendations  Bath time: Always use lukewarm water. AVOID very hot or cold water. Keep bathing time to 5-10 minutes. Do NOT use bubble bath. Use a mild soap and use just enough to wash the dirty areas. Do NOT scrub skin vigorously.  After bathing, pat dry your skin with a towel. Do NOT rub or scrub the skin.  Moisturizers and prescriptions:  ALWAYS apply moisturizers immediately after bathing (within 3 minutes). This helps to lock-in moisture. Use the moisturizer several times a day over the whole body. Good summer moisturizers include: Aveeno, CeraVe, Cetaphil. Good winter moisturizers include: Aquaphor, Vaseline, Cerave, Cetaphil, Eucerin, Vanicream. When using moisturizers along with medications, the moisturizer should be applied about one hour after applying the medication to prevent diluting effect of the medication or moisturize around where you applied the medications. When not using medications, the moisturizer can be continued twice daily as maintenance.  Laundry and clothing: Avoid laundry products with added color or perfumes. Use unscented hypo-allergenic laundry products such as Tide free, Cheer free & gentle, and All free and clear.  If the skin still seems dry  or sensitive, you can try double-rinsing the clothes. Avoid tight or  scratchy clothing such as wool. Do not use fabric softeners or dyer sheets.

## 2022-01-12 NOTE — Telephone Encounter (Signed)
PA has been submitted through CoverMyMeds for Eucrisa and is currently pending approval/denial.

## 2022-01-12 NOTE — Assessment & Plan Note (Signed)
On the hands. . Use Eucrisa (crisaborole) 2% ointment twice a day on mild rash flares on the face and body. This is a non-steroid ointment. Sample given. . Continue proper skin care.

## 2022-01-12 NOTE — Assessment & Plan Note (Signed)
Past history - Diagnosed with asthma over 20+ years ago. Not on any maintenance inhalers - some caused thrush in the past. Tried Advair, Wixela and Symbicort.  5 courses of prednisone this year. Current smoker. Was on Dupixent for eczema but stopped due to some side effects. 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 Clinically feeling improved. 06/25/2021 CXR no acute process.  2023 CT chest done. Interim history - started [place] in June 2023. Symptoms flared as ran out of [place]. On prednisone now.  Finish steroids.  . Take omeprazole while on steroids.  . Continue Fasenra injections every 8 weeks at home.  . Daily controller medication(s): continue Breztri 2 puffs twice a day with spacer and rinse mouth afterwards. Samples given.  . During upper respiratory infections/asthma flares:  o Start budesonide 0.'5mg'$  nebulizer twice a day for 1-2 weeks until your breathing symptoms return to baseline.  o Pretreat with albuterol 2 puffs or albuterol nebulizer.  o If you need to use your albuterol nebulizer machine back to back within 15-30 minutes with no relief then please go to the ER/urgent care for further evaluation.  . May use albuterol rescue inhaler 2 puffs or nebulizer every 4 to 6 hours as needed for shortness of breath, chest tightness, coughing, and wheezing. May use albuterol rescue inhaler 2 puffs 5 to 15 minutes prior to strenuous physical activities. Monitor frequency of use.  . Get spirometry at next visit.

## 2022-01-12 NOTE — Assessment & Plan Note (Signed)
Past history - Peanuts caused hives and wheezing. Avoiding shellfish and tree nuts due to positive testing in the past. Tolerates shrimp. 2023 bloodwork positive to tree nuts, peanuts, shellfish, mollusks.  More likely to be anaphylactic to hazelnuts, walnuts, cashews, [place] nuts, peanuts. Continue strict avoidance of peanuts, tree nuts, shellfish, mollusks. For mild symptoms you can take over the counter antihistamines such as Benadryl and monitor symptoms closely. If symptoms worsen or if you have severe symptoms including breathing issues, throat closure, significant swelling, whole body hives, severe diarrhea and vomiting, lightheadedness then inject epinephrine and seek immediate medical care afterwards. Emergency action plan in place.  

## 2022-01-12 NOTE — Assessment & Plan Note (Signed)
Past history - 2023 Environmental allergy panel was positive to dust mites, cat, dog, grass, mold, trees, mouse, ragweed, weed pollen but IgE was very high (12K). Does not like Ryaltris. Use over the counter antihistamines such as Zyrtec (cetirizine), Claritin (loratadine), Allegra (fexofenadine), or Xyzal (levocetirizine) daily as needed. May take twice a day during allergy flares. May switch antihistamines every few months. Continue Singulair (montelukast) 10mg daily at night. Nasal saline spray (i.e., Simply Saline) or nasal saline lavage (i.e., NeilMed) is recommended as needed and prior to medicated nasal sprays. Continue environmental control measures. 

## 2022-01-12 NOTE — Assessment & Plan Note (Signed)
Past history - Ige level over 12K. Denies skin abscesses, retained primary teeth, fractures, scoliosis, GI issues. Negative genetic testing for HIES. Elevated IgE most likely due to her eczema. 

## 2022-01-13 NOTE — Telephone Encounter (Signed)
PA has been approve for [place]. PA has been faxed to patients pharmacy, labeled, and placed in bulk scanning.

## 2022-01-18 DIAGNOSIS — M5386 Other specified dorsopathies, lumbar region: Secondary | ICD-10-CM | POA: Diagnosis not present

## 2022-01-18 DIAGNOSIS — M5431 Sciatica, right side: Secondary | ICD-10-CM | POA: Diagnosis not present

## 2022-01-18 DIAGNOSIS — M9902 Segmental and somatic dysfunction of thoracic region: Secondary | ICD-10-CM | POA: Diagnosis not present

## 2022-01-18 DIAGNOSIS — M9903 Segmental and somatic dysfunction of lumbar region: Secondary | ICD-10-CM | POA: Diagnosis not present

## 2022-02-01 DIAGNOSIS — K2100 Gastro-esophageal reflux disease with esophagitis, without bleeding: Secondary | ICD-10-CM | POA: Diagnosis not present

## 2022-02-01 DIAGNOSIS — Z79899 Other long term (current) drug therapy: Secondary | ICD-10-CM | POA: Diagnosis not present

## 2022-02-01 DIAGNOSIS — J4540 Moderate persistent asthma, uncomplicated: Secondary | ICD-10-CM | POA: Diagnosis not present

## 2022-02-01 DIAGNOSIS — L209 Atopic dermatitis, unspecified: Secondary | ICD-10-CM | POA: Diagnosis not present

## 2022-03-11 DIAGNOSIS — Z79899 Other long term (current) drug therapy: Secondary | ICD-10-CM | POA: Diagnosis not present

## 2022-03-11 DIAGNOSIS — J4520 Mild intermittent asthma, uncomplicated: Secondary | ICD-10-CM | POA: Diagnosis not present

## 2022-03-11 DIAGNOSIS — K2100 Gastro-esophageal reflux disease with esophagitis, without bleeding: Secondary | ICD-10-CM | POA: Diagnosis not present

## 2022-03-11 DIAGNOSIS — L209 Atopic dermatitis, unspecified: Secondary | ICD-10-CM | POA: Diagnosis not present

## 2022-04-19 NOTE — Progress Notes (Unsigned)
Follow Up Note  RE: Barbara Espinoza MRN: [id number] DOB: 1970-10-07 Date of Office Visit: 04/20/2022  Referring provider: Vincente Liberty, MD Primary care provider: Vincente Liberty, MD  Chief Complaint: No chief complaint on file.  History of Present Illness: I had the pleasure of seeing Barbara Espinoza for a follow up visit at the [facility] on 04/19/2022. She is a 51 y.o. female, who is being followed for asthma on Fasenra, allergic rhinitis, dyshidrotic eczema, elevated IgE level, food allergy. Her previous allergy office visit was on 01/12/2022 with Dr. Maudie Mercury. Today is a regular follow up visit.  HAE??? diagnosis on the chart  if not better - start TEzspire.   Asthma, not well controlled Past history - Diagnosed with asthma over 20+ years ago. Not on any maintenance inhalers - some caused thrush in the past. Tried Advair, Wixela and Symbicort.  5 courses of prednisone this year. Current smoker. Was on Dupixent for eczema but stopped due to some side effects. 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 Clinically feeling improved. 06/25/2021 CXR no acute process.  2023 CT chest done. Interim history - started [place] in June 2023. Symptoms flared as ran out of [place]. On prednisone now. Finish steroids.  Take omeprazole while on steroids.  Continue Fasenra injections every 8 weeks at home.  Daily controller medication(s): continue Breztri 2 puffs twice a day with spacer and rinse mouth afterwards. Samples given.  During upper respiratory infections/asthma flares:  Start budesonide 0.'5mg'$  nebulizer twice a day for 1-2 weeks until your breathing symptoms return to baseline.  Pretreat with albuterol 2 puffs or albuterol nebulizer.  If you need to use your albuterol nebulizer machine back to back within 15-30 minutes with no relief then please go to the ER/urgent care for further  evaluation.  May use albuterol rescue inhaler 2 puffs or nebulizer every 4 to 6 hours as needed for shortness of breath, chest tightness, coughing, and wheezing. May use albuterol rescue inhaler 2 puffs 5 to 15 minutes prior to strenuous physical activities. Monitor frequency of use.  Get spirometry at next visit.   Other allergic rhinitis Past history - 2023 Environmental allergy panel was positive to dust mites, cat, dog, grass, mold, trees, mouse, ragweed, weed pollen but IgE was very high (12K). Does not like Ryaltris. Use over the counter antihistamines such as Zyrtec (cetirizine), Claritin (loratadine), Allegra (fexofenadine), or Xyzal (levocetirizine) daily as needed. May take twice a day during allergy flares. May switch antihistamines every few months. Continue Singulair (montelukast) '10mg'$  daily at night. Nasal saline spray (i.e., Simply Saline) or nasal saline lavage (i.e., NeilMed) is recommended as needed and prior to medicated nasal sprays. Continue environmental control measures.    Dyshidrotic eczema On the hands. Use Eucrisa (crisaborole) 2% ointment twice a day on mild rash flares on the face and body. This is a non-steroid ointment. Sample given. Continue proper skin care.    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 Denies skin abscesses, retained primary teeth, fractures, scoliosis, GI issues. Negative genetic testing for HIES. Elevated IgE most likely due to her eczema.   Anaphylactic reaction due to food, subsequent encounter Past history - Peanuts caused hives and wheezing. Avoiding shellfish and tree nuts due to positive testing in the past. Tolerates shrimp. 2023 bloodwork positive to tree nuts, peanuts, shellfish, mollusks.  More likely to be anaphylactic to hazelnuts, walnuts, cashews, [place] nuts, peanuts.  Continue strict avoidance of peanuts, tree nuts, shellfish, mollusks. For mild symptoms you can take over the counter antihistamines such as Benadryl  and monitor symptoms closely. If symptoms worsen or if you have severe symptoms including breathing issues, throat closure, significant swelling, whole body hives, severe diarrhea and vomiting, lightheadedness then inject epinephrine and seek immediate medical care afterwards. Emergency action plan in place.    Return in about 3 months (around 04/14/2022).  Assessment and Plan: [name] is a 51 y.o. female with: No problem-specific Assessment & Plan notes found for this encounter.  No follow-ups on file.  No orders of the defined types were placed in this encounter.  Lab Orders  No laboratory test(s) ordered today    Diagnostics: Spirometry:  Tracings reviewed. Her effort: {Blank single:19197::"Good reproducible efforts.","It was hard to get consistent efforts and there is a question as to whether this reflects a maximal maneuver.","Poor effort, data can not be interpreted."} FVC: ***L FEV1: ***L, ***% predicted FEV1/FVC ratio: ***% Interpretation: {Blank single:19197::"Spirometry consistent with mild obstructive disease","Spirometry consistent with moderate obstructive disease","Spirometry consistent with severe obstructive disease","Spirometry consistent with possible restrictive disease","Spirometry consistent with mixed obstructive and restrictive disease","Spirometry uninterpretable due to technique","Spirometry consistent with normal pattern","No overt abnormalities noted given today's efforts"}.  Please see scanned spirometry results for details.  Skin Testing: {Blank single:19197::"Select foods","Environmental allergy panel","Environmental allergy panel and select foods","Food allergy panel","None","Deferred due to recent antihistamines use"}. *** Results discussed with patient/family.   Medication List:  Current Outpatient Medications  Medication Sig Dispense Refill  . albuterol (PROVENTIL HFA;VENTOLIN HFA) 108 (90 BASE) MCG/ACT inhaler Inhale 2 puffs into the lungs every 4  (four) hours as needed. Shortness of breath and wheezing     . albuterol (PROVENTIL) (2.5 MG/3ML) 0.083% nebulizer solution Take 2.5 mg by nebulization every 6 (six) hours as needed. Shortness of breath and wheezing     . Budeson-Glycopyrrol-Formoterol (BREZTRI AEROSPHERE) 160-9-4.8 MCG/ACT AERO Inhale 2 puffs into the lungs in the morning and at bedtime. with spacer and rinse mouth afterwards. 10.7 g 3  . budesonide (PULMICORT) 0.5 MG/2ML nebulizer solution INHALE 2 MLS BY NEBULIZATION IN THE MORNING AND AT BEDTIME. 360 mL 0  . Crisaborole (EUCRISA) 2 % OINT Apply 1 Application topically 2 (two) times daily as needed (mild rash). 100 g 5  . EPINEPHrine 0.3 mg/0.3 mL IJ SOAJ injection Inject 0.3 mg into the muscle as needed for anaphylaxis. 1 each 2  . escitalopram (LEXAPRO) 5 MG tablet Take 1 tablet by mouth daily.    . montelukast (SINGULAIR) 10 MG tablet Take 1 tablet (10 mg total) by mouth at bedtime. 30 tablet 5  . Multiple Vitamins-Minerals (HAIR SKIN AND NAILS FORMULA) TABS Take by mouth.    Marland Kitchen omeprazole (PRILOSEC) 20 MG capsule Take 1 capsule (20 mg total) by mouth daily. 30 capsule 1  . triamcinolone ointment (KENALOG) 0.1 % Apply 1 application topically daily. Use as a moisturizer. 453 g 3   No current facility-administered medications for this visit.   Allergies: Allergies  Allergen Reactions  . Peanut-Containing Drug Products Hives    "NUTS" Reaction: wheezing  . Shellfish Allergy Hives    Reaction: wheezing   I reviewed her past medical history, social history, family history, and environmental history and no significant changes have been reported from her previous visit.  Review of Systems  Constitutional:  Negative for appetite change, chills, fever and unexpected weight change.  HENT:  Negative for congestion and rhinorrhea.   Respiratory:  Positive for cough, chest  tightness, shortness of breath and wheezing.   Cardiovascular:  Negative for chest pain.   Gastrointestinal:  Negative for abdominal pain.  Genitourinary:  Negative for difficulty urinating.  Skin:  Positive for rash.  Allergic/Immunologic: Positive for environmental allergies and food allergies.  Neurological:  Negative for headaches.   Objective: There were no vitals taken for this visit. There is no height or weight on file to calculate BMI. Physical Exam Vitals and nursing note reviewed.  Constitutional:      Appearance: Normal appearance. She is well-developed.  HENT:     Head: Normocephalic and atraumatic.     Right Ear: Tympanic membrane and external ear normal.     Left Ear: Tympanic membrane and external ear normal.     Nose: Nose normal.     Mouth/Throat:     Mouth: Mucous membranes are moist.     Pharynx: Oropharynx is clear.  Eyes:     Conjunctiva/sclera: Conjunctivae normal.  Cardiovascular:     Rate and Rhythm: Normal rate and regular rhythm.     Heart sounds: Normal heart sounds. No murmur heard. Pulmonary:     Effort: Pulmonary effort is normal.     Breath sounds: Normal breath sounds. No wheezing, rhonchi or rales.  Musculoskeletal:     Cervical back: Neck supple.  Skin:    General: Skin is warm and dry.     Findings: No rash.     Comments: Dry, fissured skin on the hands b/l.  Neurological:     Mental Status: She is alert and oriented to person, place, and time.  Psychiatric:        Behavior: Behavior normal.  Previous notes and tests were reviewed. The plan was reviewed with the patient/family, and all questions/concerned were addressed.  It was my pleasure to see Shantee today and participate in her care. Please feel free to contact me with any questions or concerns.  Sincerely,  Rexene Alberts, DO Allergy & Immunology  [facility] office: [place] office: [phone]

## 2022-04-20 ENCOUNTER — Ambulatory Visit: Payer: Federal, State, Local not specified - PPO | Admitting: Allergy

## 2022-04-20 ENCOUNTER — Other Ambulatory Visit: Payer: Self-pay

## 2022-04-20 ENCOUNTER — Encounter: Payer: Self-pay | Admitting: Allergy

## 2022-04-20 VITALS — BP 110/74 | HR 57 | Temp 99.5°F | Resp 16 | Ht 65.0 in | Wt 206.0 lb

## 2022-04-20 DIAGNOSIS — J3089 Other allergic rhinitis: Secondary | ICD-10-CM

## 2022-04-20 DIAGNOSIS — J4551 Severe persistent asthma with (acute) exacerbation: Secondary | ICD-10-CM

## 2022-04-20 DIAGNOSIS — L301 Dyshidrosis [pompholyx]: Secondary | ICD-10-CM

## 2022-04-20 DIAGNOSIS — T7800XD Anaphylactic reaction due to unspecified food, subsequent encounter: Secondary | ICD-10-CM | POA: Diagnosis not present

## 2022-04-20 DIAGNOSIS — J4550 Severe persistent asthma, uncomplicated: Secondary | ICD-10-CM

## 2022-04-20 DIAGNOSIS — R768 Other specified abnormal immunological findings in serum: Secondary | ICD-10-CM

## 2022-04-20 MED ORDER — METHYLPREDNISOLONE ACETATE 40 MG/ML IJ SUSP
40.0000 mg | Freq: Once | INTRAMUSCULAR | Status: AC
  Administered 2022-04-20: 40 mg via INTRAMUSCULAR

## 2022-04-20 MED ORDER — ALBUTEROL SULFATE HFA 108 (90 BASE) MCG/ACT IN AERS: 2.0000 | INHALATION_SPRAY | RESPIRATORY_TRACT | 1 refills | Status: AC | PRN

## 2022-04-20 NOTE — Assessment & Plan Note (Signed)
Past history - Peanuts caused hives and wheezing. Avoiding shellfish and tree nuts due to positive testing in the past. Tolerates shrimp. 2023 bloodwork positive to tree nuts, peanuts, shellfish, mollusks.  More likely to be anaphylactic to hazelnuts, walnuts, cashews, [place] nuts, peanuts. Continue strict avoidance of peanuts, tree nuts, shellfish, mollusks. For mild symptoms you can take over the counter antihistamines such as Benadryl and monitor symptoms closely. If symptoms worsen or if you have severe symptoms including breathing issues, throat closure, significant swelling, whole body hives, severe diarrhea and vomiting, lightheadedness then inject epinephrine and seek immediate medical care afterwards. Emergency action plan in place.

## 2022-04-20 NOTE — Assessment & Plan Note (Signed)
Past history - 2023 Environmental allergy panel was positive to dust mites, cat, dog, grass, mold, trees, mouse, ragweed, weed pollen but IgE was very high (12K). Does not like Ryaltris. Use over the counter antihistamines such as Zyrtec (cetirizine), Claritin (loratadine), Allegra (fexofenadine), or Xyzal (levocetirizine) daily as needed. May take twice a day during allergy flares. May switch antihistamines every few months. Continue Singulair (montelukast) '10mg'$  daily at night. Nasal saline spray (i.e., Simply Saline) or nasal saline lavage (i.e., NeilMed) is recommended as needed and prior to medicated nasal sprays. Continue environmental control measures.

## 2022-04-20 NOTE — Assessment & Plan Note (Signed)
Past history - Diagnosed with asthma over 20+ years ago. 5 courses of prednisone this year. Current smoker. Was on Dupixent for eczema but stopped due to some side effects. 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 Clinically feeling improved. 06/25/2021 CXR no acute process.  2023 CT chest done. Started Berna Bue in June 2023 Interim history - symptoms flared and used prednisone in October and November. Still not back to baseline.  Today's spirometry showed mixed obstructive and restrictive disease. Take omeprazole while on steroids.  Steroid injection given today. Continue Fasenra injections.  Will try to switch over Tezspire injection every 4 weeks.  Daily controller medication(s): continue Breztri 2 puffs twice a day with spacer and rinse mouth afterwards.  During upper respiratory infections/asthma flares:  Start budesonide 0.'5mg'$  nebulizer twice a day for 1-2 weeks until your breathing symptoms return to baseline.  Pretreat with albuterol 2 puffs or albuterol nebulizer.  If you need to use your albuterol nebulizer machine back to back within 15-30 minutes with no relief then please go to the ER/urgent care for further evaluation.  May use albuterol rescue inhaler 2 puffs or nebulizer every 4 to 6 hours as needed for shortness of breath, chest tightness, coughing, and wheezing. May use albuterol rescue inhaler 2 puffs 5 to 15 minutes prior to strenuous physical activities. Monitor frequency of use.  Get spirometry at next visit.

## 2022-04-20 NOTE — Assessment & Plan Note (Signed)
Past history - Ige level over 12K. Denies skin abscesses, retained primary teeth, fractures, scoliosis, GI issues. Negative genetic testing for HIES. Elevated IgE most likely due to her eczema.

## 2022-04-20 NOTE — Patient Instructions (Addendum)
Asthma:  Take omeprazole while on steroids.  Steroid injection given today.  Continue Fasenra injections.  Will try to switch over Tezspire injection every 4 weeks.  Daily controller medication(s): continue Breztri 2 puffs twice a day with spacer and rinse mouth afterwards.  During upper respiratory infections/asthma flares:  Start budesonide 0.'5mg'$  nebulizer twice a day for 1-2 weeks until your breathing symptoms return to baseline.  Pretreat with albuterol 2 puffs or albuterol nebulizer.  If you need to use your albuterol nebulizer machine back to back within 15-30 minutes with no relief then please go to the ER/urgent care for further evaluation.  May use albuterol rescue inhaler 2 puffs or nebulizer every 4 to 6 hours as needed for shortness of breath, chest tightness, coughing, and wheezing. May use albuterol rescue inhaler 2 puffs 5 to 15 minutes prior to strenuous physical activities. Monitor frequency of use.  Asthma control goals:  Full participation in all desired activities (may need albuterol before activity) Albuterol use two times or less a week on average (not counting use with activity) Cough interfering with sleep two times or less a month Oral steroids no more than once a year No hospitalizations   Infections Keep track of infections and antibiotics use.  Rhinitis:  Use over the counter antihistamines such as Zyrtec (cetirizine), Claritin (loratadine), Allegra (fexofenadine), or Xyzal (levocetirizine) daily as needed. May take twice a day during allergy flares. May switch antihistamines every few months. Continue Singulair (montelukast) '10mg'$  daily at night. Nasal saline spray (i.e., Simply Saline) or nasal saline lavage (i.e., NeilMed) is recommended as needed and prior to medicated nasal sprays. Continue environmental control measures.   Food allergies Continue strict avoidance of peanuts, tree nuts, shellfish, mollusks. For mild symptoms you can take over the counter  antihistamines such as Benadryl and monitor symptoms closely. If symptoms worsen or if you have severe symptoms including breathing issues, throat closure, significant swelling, whole body hives, severe diarrhea and vomiting, lightheadedness then inject epinephrine and seek immediate medical care afterwards. Emergency action plan in place.   Eczema: Use Eucrisa (crisaborole) 2% ointment twice a day on mild rash flares on the face and body. This is a non-steroid ointment.  If it burns, place the medication in the refrigerator.  Apply a thin layer of moisturizer and then apply the Eucrisa on top of it. Use triamcinolone 0.1% ointment twice a day as needed for rash flares. Do not use on the face, neck, armpits or groin area. Do not use more than 3 weeks in a row.  Continue proper skin care.   Follow up in 3 months or sooner if needed.  Skin care recommendations  Bath time: Always use lukewarm water. AVOID very hot or cold water. Keep bathing time to 5-10 minutes. Do NOT use bubble bath. Use a mild soap and use just enough to wash the dirty areas. Do NOT scrub skin vigorously.  After bathing, pat dry your skin with a towel. Do NOT rub or scrub the skin.  Moisturizers and prescriptions:  ALWAYS apply moisturizers immediately after bathing (within 3 minutes). This helps to lock-in moisture. Use the moisturizer several times a day over the whole body. Good summer moisturizers include: Aveeno, CeraVe, Cetaphil. Good winter moisturizers include: Aquaphor, Vaseline, Cerave, Cetaphil, Eucerin, Vanicream. When using moisturizers along with medications, the moisturizer should be applied about one hour after applying the medication to prevent diluting effect of the medication or moisturize around where you applied the medications. When not using medications, the moisturizer  can be continued twice daily as maintenance.  Laundry and clothing: Avoid laundry products with added color or perfumes. Use  unscented hypo-allergenic laundry products such as Tide free, Cheer free & gentle, and All free and clear.  If the skin still seems dry or sensitive, you can try double-rinsing the clothes. Avoid tight or scratchy clothing such as wool. Do not use fabric softeners or dyer sheets.

## 2022-04-20 NOTE — Assessment & Plan Note (Signed)
On the hands - improved since just took prednisone.  Use Eucrisa (crisaborole) 2% ointment twice a day on mild rash flares on the face and body. This is a non-steroid ointment.  Use triamcinolone 0.1% ointment twice a day as needed for rash flares. Do not use on the face, neck, armpits or groin area. Do not use more than 3 weeks in a row.

## 2022-04-29 DIAGNOSIS — J4520 Mild intermittent asthma, uncomplicated: Secondary | ICD-10-CM | POA: Diagnosis not present

## 2022-04-29 DIAGNOSIS — K2100 Gastro-esophageal reflux disease with esophagitis, without bleeding: Secondary | ICD-10-CM | POA: Diagnosis not present

## 2022-04-29 DIAGNOSIS — L209 Atopic dermatitis, unspecified: Secondary | ICD-10-CM | POA: Diagnosis not present

## 2022-04-29 DIAGNOSIS — Z79899 Other long term (current) drug therapy: Secondary | ICD-10-CM | POA: Diagnosis not present

## 2022-05-05 ENCOUNTER — Telehealth: Payer: Self-pay | Admitting: *Deleted

## 2022-05-05 NOTE — Telephone Encounter (Signed)
Spoke to patient and advised approval, copay card and submit to [facility]. Instructed patient on initial injection to make appt and get same in clinic

## 2022-05-05 NOTE — Telephone Encounter (Signed)
-----   Message from Garnet Sierras, DO sent at 04/20/2022 11:09 PM EST ----- Please start PA for Tezspire for asthma. Patient is on Berna Bue but still had to take prednisone twice on it. Thank you.

## 2022-05-05 NOTE — Telephone Encounter (Signed)
Tried to reach patient to advised change to [facility] and submit but unable to leave message. My chart message sent

## 2022-05-06 ENCOUNTER — Encounter: Payer: Self-pay | Admitting: Allergy

## 2022-05-26 DIAGNOSIS — J40 Bronchitis, not specified as acute or chronic: Secondary | ICD-10-CM | POA: Diagnosis not present

## 2022-05-26 DIAGNOSIS — J45901 Unspecified asthma with (acute) exacerbation: Secondary | ICD-10-CM | POA: Diagnosis not present

## 2022-05-26 DIAGNOSIS — R051 Acute cough: Secondary | ICD-10-CM | POA: Diagnosis not present

## 2022-05-26 DIAGNOSIS — J029 Acute pharyngitis, unspecified: Secondary | ICD-10-CM | POA: Diagnosis not present

## 2022-06-08 ENCOUNTER — Ambulatory Visit (INDEPENDENT_AMBULATORY_CARE_PROVIDER_SITE_OTHER): Payer: Federal, State, Local not specified - PPO

## 2022-06-08 DIAGNOSIS — J4550 Severe persistent asthma, uncomplicated: Secondary | ICD-10-CM

## 2022-06-08 MED ORDER — TEZEPELUMAB-EKKO 210 MG/1.91ML ~~LOC~~ SOSY
210.0000 mg | PREFILLED_SYRINGE | Freq: Once | SUBCUTANEOUS | Status: AC
  Administered 2022-06-08: 210 mg via SUBCUTANEOUS

## 2022-06-15 DIAGNOSIS — J4540 Moderate persistent asthma, uncomplicated: Secondary | ICD-10-CM | POA: Diagnosis not present

## 2022-06-15 DIAGNOSIS — K2100 Gastro-esophageal reflux disease with esophagitis, without bleeding: Secondary | ICD-10-CM | POA: Diagnosis not present

## 2022-06-15 DIAGNOSIS — Z79899 Other long term (current) drug therapy: Secondary | ICD-10-CM | POA: Diagnosis not present

## 2022-06-15 DIAGNOSIS — L209 Atopic dermatitis, unspecified: Secondary | ICD-10-CM | POA: Diagnosis not present

## 2022-06-19 ENCOUNTER — Encounter: Payer: Self-pay | Admitting: Allergy

## 2022-06-21 ENCOUNTER — Ambulatory Visit: Payer: Federal, State, Local not specified - PPO | Admitting: Allergy

## 2022-06-21 ENCOUNTER — Other Ambulatory Visit: Payer: Self-pay

## 2022-06-21 ENCOUNTER — Encounter: Payer: Self-pay | Admitting: Allergy

## 2022-06-21 VITALS — BP 124/78 | HR 57 | Temp 98.3°F | Resp 20

## 2022-06-21 DIAGNOSIS — J4551 Severe persistent asthma with (acute) exacerbation: Secondary | ICD-10-CM

## 2022-06-21 DIAGNOSIS — B999 Unspecified infectious disease: Secondary | ICD-10-CM

## 2022-06-21 DIAGNOSIS — T7800XD Anaphylactic reaction due to unspecified food, subsequent encounter: Secondary | ICD-10-CM | POA: Diagnosis not present

## 2022-06-21 DIAGNOSIS — R768 Other specified abnormal immunological findings in serum: Secondary | ICD-10-CM

## 2022-06-21 DIAGNOSIS — L301 Dyshidrosis [pompholyx]: Secondary | ICD-10-CM

## 2022-06-21 DIAGNOSIS — L2089 Other atopic dermatitis: Secondary | ICD-10-CM

## 2022-06-21 DIAGNOSIS — J3089 Other allergic rhinitis: Secondary | ICD-10-CM

## 2022-06-21 MED ORDER — PREDNISONE 20 MG PO TABS
20.0000 mg | ORAL_TABLET | Freq: Every day | ORAL | 0 refills | Status: DC
Start: 2022-06-21 — End: 2023-04-14

## 2022-06-21 NOTE — Progress Notes (Signed)
Follow Up Note  RE: [name] MRN: [id number] DOB: 07-19-1970 Date of Office Visit: 06/21/2022  Referring provider: Vincente Liberty, MD Primary care provider: Vincente Liberty, MD  Chief Complaint: Not well controlled severe persistent asthma with acute exa and Follow-up  History of Present Illness: I had the pleasure of seeing Barbara Espinoza for a follow up visit at the [facility] on 06/21/2022. She is a 52 y.o. female, who is being followed for asthma on Tezspire, allergic rhinitis, dyshidrotic eczema, elevated IgE level, food allergy . Her previous allergy office visit was on 04/20/2022 with Dr. Maudie Mercury. Today is a new complaint visit of needing forms filled out .  Asthma Still using nebulizer and albuterol every 6 hours due to shortness of breath, wheezing and coughing for the past few days.  Switched to [employer] in January 2024.  Still doing Breztri 2 puffs BID.   Had prednisone in January due to respiratory infection.  Patient is smoking about 3-5 cigarettes per day.  Needs letter for work as she has been missing days due to her asthma but she is not ready to retire.   Other allergic rhinitis Taking Singulair daily.   Dyshidrotic eczema Using Eucrisa on the face with good benefit.  Assessment and Plan: [name] is a 52 y.o. female with: Asthma, not well controlled Past history - Diagnosed with asthma over 20+ years ago. 5 courses of prednisone this year. Current smoker. Was on Dupixent for eczema but stopped due to feet swelling. 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 Clinically feeling improved. 06/25/2021 CXR no acute process.  2023 CT chest done. Started Berna Bue in June 2023 Interim history - had prednisone in January, currently symptoms flared. Started Tezspire in January 2024. Today's spirometry showed mixed obstructive and restrictive disease with 20%  improvement in FEV1 post bronchodilator treatment. Clinically feeling slightly improved.  Continue Tezspire injection every 4 weeks.  Take prednisone '20mg'$  once a day for 5 days.  Quit smoking.  Letter written for work.  Daily controller medication(s): continue Breztri 2 puffs twice a day with spacer and rinse mouth afterwards.  Continue Singulair (montelukast) '10mg'$  daily at night. Start budesonide 0.'5mg'$  nebulizer once a day.  During respiratory infections/asthma flares:  Start budesonide 0.'5mg'$  nebulizer twice a day for 1-2 weeks until your breathing symptoms return to baseline.  Pretreat with albuterol 2 puffs or albuterol nebulizer.  If you need to use your albuterol nebulizer machine back to back within 15-30 minutes with no relief then please go to the ER/urgent care for further evaluation.  May use albuterol rescue inhaler 2 puffs or nebulizer every 4 to 6 hours as needed for shortness of breath, chest tightness, coughing, and wheezing. May use albuterol rescue inhaler 2 puffs 5 to 15 minutes prior to strenuous physical activities. Monitor frequency of use.  Get spirometry at next visit. If no improvement with Tezspire after 6 months - consider trying Xolair next.   Other allergic rhinitis Past history - 2023 Environmental allergy panel was positive to dust mites, cat, dog, grass, mold, trees, mouse, ragweed, weed pollen but IgE was very high (12K). Does not like Ryaltris. Interim history - has dogs at home.  Use over the counter antihistamines such as Zyrtec (cetirizine), Claritin (loratadine), Allegra (fexofenadine), or Xyzal (levocetirizine) daily as needed. May take twice a day during allergy flares. May switch antihistamines every few months. Continue Singulair (montelukast) '10mg'$  daily at night. Nasal  saline spray (i.e., Simply Saline) or nasal saline lavage (i.e., NeilMed) is recommended as needed and prior to medicated nasal sprays. Continue environmental control measures.    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 Denies skin abscesses, retained primary teeth, fractures, scoliosis, GI issues. Negative genetic testing for HIES. Elevated IgE most likely due to her eczema.  Food allergy Past history - Peanuts caused hives and wheezing. Avoiding shellfish and tree nuts due to positive testing in the past. Tolerates shrimp. 2023 bloodwork positive to tree nuts, peanuts, shellfish, mollusks.  More likely to be anaphylactic to hazelnuts, walnuts, cashews, [place] nuts, peanuts. Continue strict avoidance of peanuts, tree nuts, shellfish, mollusks. For mild symptoms you can take over the counter antihistamines such as Benadryl and monitor symptoms closely. If symptoms worsen or if you have severe symptoms including breathing issues, throat closure, significant swelling, whole body hives, severe diarrhea and vomiting, lightheadedness then inject epinephrine and seek immediate medical care afterwards. Emergency action plan in place.   Recurrent infections Past history - normal immunoglobulin levels. Good response to pneumovax, positive tetanus/diptheria titers. Interim history - One antibiotics since the last visit. Keep track of infections and antibiotics use.   Other atopic dermatitis Past history - Saw dermatology and was on [place] in the past. Stopped as it caused her skin to feel hypersensitive and swollen feet. Interim history - Flared on neck. Use Eucrisa (crisaborole) 2% ointment twice a day on mild rash flares on the face and body. This is a non-steroid ointment.  Use triamcinolone 0.1% ointment twice a day as needed for rash flares. Do not use on the face, neck, armpits or groin area. Do not use more than 3 weeks in a row.  Return in about 2 months (around 08/20/2022).  Meds ordered this encounter  Medications   predniSONE (DELTASONE) 20 MG tablet    Sig: Take 1 tablet (20 mg total) by mouth daily with breakfast.    Dispense:  5 tablet     Refill:  0   Lab Orders  No laboratory test(s) ordered today    Diagnostics: Spirometry:  Tracings reviewed. Her effort: Good reproducible efforts. FVC: 1.51L FEV1: 0.99L, 40% predicted FEV1/FVC ratio: 66% Interpretation: Spirometry consistent with mixed obstructive and restrictive disease with 20% improvement in FEV1 post bronchodilator treatment. Clinically feeling slightly improved.   Please see scanned spirometry results for details.  Medication List:  Current Outpatient Medications  Medication Sig Dispense Refill   albuterol (PROAIR HFA) 108 (90 Base) MCG/ACT inhaler Inhale 2 puffs into the lungs every 4 (four) hours as needed for wheezing or shortness of breath (coughing fits). 18 g 1   albuterol (PROVENTIL HFA;VENTOLIN HFA) 108 (90 BASE) MCG/ACT inhaler Inhale 2 puffs into the lungs every 4 (four) hours as needed. Shortness of breath and wheezing      albuterol (PROVENTIL) (2.5 MG/3ML) 0.083% nebulizer solution Take 2.5 mg by nebulization every 6 (six) hours as needed. Shortness of breath and wheezing      Budeson-Glycopyrrol-Formoterol (BREZTRI AEROSPHERE) 160-9-4.8 MCG/ACT AERO Inhale 2 puffs into the lungs in the morning and at bedtime. with spacer and rinse mouth afterwards. 10.7 g 3   budesonide (PULMICORT) 0.5 MG/2ML nebulizer solution INHALE 2 MLS BY NEBULIZATION IN THE MORNING AND AT BEDTIME. 360 mL 0   Crisaborole (EUCRISA) 2 % OINT Apply 1 Application topically 2 (two) times daily as needed (mild rash). 100 g 5   dexamethasone (DECADRON) 4 MG tablet Take 4 mg by mouth as needed.  EPINEPHrine 0.3 mg/0.3 mL IJ SOAJ injection Inject 0.3 mg into the muscle as needed for anaphylaxis. 1 each 2   escitalopram (LEXAPRO) 10 MG tablet Take 10 mg by mouth daily.     levalbuterol (XOPENEX) 0.63 MG/3ML nebulizer solution Take by nebulization every 6 (six) hours.     montelukast (SINGULAIR) 10 MG tablet Take 1 tablet (10 mg total) by mouth at bedtime. 30 tablet 5   Multiple  Vitamins-Minerals (HAIR SKIN AND NAILS FORMULA) TABS Take by mouth.     olopatadine (PATANOL) 0.1 % ophthalmic solution 1-2 drops 2 (two) times daily.     omeprazole (PRILOSEC) 20 MG capsule Take 1 capsule (20 mg total) by mouth daily. 30 capsule 1   predniSONE (DELTASONE) 20 MG tablet Take 1 tablet (20 mg total) by mouth daily with breakfast. 5 tablet 0   TEZSPIRE 210 MG/1.91ML SOAJ Inject into the skin.     triamcinolone ointment (KENALOG) 0.1 % Apply 1 application topically daily. Use as a moisturizer. 453 g 3   No current facility-administered medications for this visit.   Allergies: Allergies  Allergen Reactions   Peanut-Containing Drug Products Hives    "NUTS" Reaction: wheezing   Shellfish Allergy Hives    Reaction: wheezing   I reviewed her past medical history, social history, family history, and environmental history and no significant changes have been reported from her previous visit.  Review of Systems  Constitutional:  Negative for appetite change, chills, fever and unexpected weight change.  HENT:  Negative for congestion and rhinorrhea.   Respiratory:  Positive for cough, chest tightness, shortness of breath and wheezing.   Cardiovascular:  Negative for chest pain.  Gastrointestinal:  Negative for abdominal pain.  Genitourinary:  Negative for difficulty urinating.  Skin:  Positive for rash.  Allergic/Immunologic: Positive for environmental allergies and food allergies.  Neurological:  Negative for headaches.    Objective: BP 124/78   Pulse (!) 57   Temp 98.3 F (36.8 C) (Temporal)   Resp 20   SpO2 95%  There is no height or weight on file to calculate BMI. Physical Exam Vitals and nursing note reviewed.  Constitutional:      Appearance: Normal appearance. She is well-developed.  HENT:     Head: Normocephalic and atraumatic.     Right Ear: Tympanic membrane and external ear normal.     Left Ear: Tympanic membrane and external ear normal.     Nose: Nose  normal.     Mouth/Throat:     Mouth: Mucous membranes are moist.     Pharynx: Oropharynx is clear.  Eyes:     Conjunctiva/sclera: Conjunctivae normal.  Cardiovascular:     Rate and Rhythm: Normal rate and regular rhythm.     Heart sounds: Normal heart sounds. No murmur heard. Pulmonary:     Effort: Pulmonary effort is normal.     Breath sounds: Wheezing present. No rhonchi or rales.     Comments: Wheezing still present after bronchodilator treatment.  Musculoskeletal:     Cervical back: Neck supple.  Skin:    General: Skin is warm and dry.     Findings: Rash present.     Comments: Eczematous patches on the neck.  Neurological:     Mental Status: She is alert and oriented to person, place, and time.  Psychiatric:        Behavior: Behavior normal.    Previous notes and tests were reviewed. The plan was reviewed with the patient/family, and all questions/concerned were  addressed.  It was my pleasure to see Barbara Espinoza today and participate in her care. Please feel free to contact me with any questions or concerns.  Sincerely,  Rexene Alberts, DO Allergy & Immunology  [facility] office: [place] office: [phone]

## 2022-06-21 NOTE — Patient Instructions (Addendum)
Asthma:  Continue Tezspire injection every 4 weeks.  Take prednisone '20mg'$  once a day for 5 days.  Quit smoking.  Letter written. Daily controller medication(s): continue Breztri 2 puffs twice a day with spacer and rinse mouth afterwards.  Continue Singulair (montelukast) '10mg'$  daily at night. Start budesonide 0.'5mg'$  nebulizer once a day.   During respiratory infections/asthma flares:  Start budesonide 0.'5mg'$  nebulizer twice a day for 1-2 weeks until your breathing symptoms return to baseline.  Pretreat with albuterol 2 puffs or albuterol nebulizer.  If you need to use your albuterol nebulizer machine back to back within 15-30 minutes with no relief then please go to the ER/urgent care for further evaluation.  May use albuterol rescue inhaler 2 puffs or nebulizer every 4 to 6 hours as needed for shortness of breath, chest tightness, coughing, and wheezing. May use albuterol rescue inhaler 2 puffs 5 to 15 minutes prior to strenuous physical activities. Monitor frequency of use.  Asthma control goals:  Full participation in all desired activities (may need albuterol before activity) Albuterol use two times or less a week on average (not counting use with activity) Cough interfering with sleep two times or less a month Oral steroids no more than once a year No hospitalizations   Infections Keep track of infections and antibiotics use.  Rhinitis:  Use over the counter antihistamines such as Zyrtec (cetirizine), Claritin (loratadine), Allegra (fexofenadine), or Xyzal (levocetirizine) daily as needed. May take twice a day during allergy flares. May switch antihistamines every few months. Continue Singulair (montelukast) '10mg'$  daily at night. Nasal saline spray (i.e., Simply Saline) or nasal saline lavage (i.e., NeilMed) is recommended as needed and prior to medicated nasal sprays. Continue environmental control measures.   Food allergies Continue strict avoidance of peanuts, tree nuts, shellfish,  mollusks. For mild symptoms you can take over the counter antihistamines such as Benadryl and monitor symptoms closely. If symptoms worsen or if you have severe symptoms including breathing issues, throat closure, significant swelling, whole body hives, severe diarrhea and vomiting, lightheadedness then inject epinephrine and seek immediate medical care afterwards. Emergency action plan in place.   Eczema: Use Eucrisa (crisaborole) 2% ointment twice a day on mild rash flares on the face and body. This is a non-steroid ointment.  If it burns, place the medication in the refrigerator.  Apply a thin layer of moisturizer and then apply the Eucrisa on top of it. Use triamcinolone 0.1% ointment twice a day as needed for rash flares. Do not use on the face, neck, armpits or groin area. Do not use more than 3 weeks in a row.  Continue proper skin care.   Follow up in 2-3 months or sooner if needed.  Skin care recommendations  Bath time: Always use lukewarm water. AVOID very hot or cold water. Keep bathing time to 5-10 minutes. Do NOT use bubble bath. Use a mild soap and use just enough to wash the dirty areas. Do NOT scrub skin vigorously.  After bathing, pat dry your skin with a towel. Do NOT rub or scrub the skin.  Moisturizers and prescriptions:  ALWAYS apply moisturizers immediately after bathing (within 3 minutes). This helps to lock-in moisture. Use the moisturizer several times a day over the whole body. Good summer moisturizers include: Aveeno, CeraVe, Cetaphil. Good winter moisturizers include: Aquaphor, Vaseline, Cerave, Cetaphil, Eucerin, Vanicream. When using moisturizers along with medications, the moisturizer should be applied about one hour after applying the medication to prevent diluting effect of the medication or moisturize around  where you applied the medications. When not using medications, the moisturizer can be continued twice daily as maintenance.  Laundry and  clothing: Avoid laundry products with added color or perfumes. Use unscented hypo-allergenic laundry products such as Tide free, Cheer free & gentle, and All free and clear.  If the skin still seems dry or sensitive, you can try double-rinsing the clothes. Avoid tight or scratchy clothing such as wool. Do not use fabric softeners or dyer sheets.

## 2022-06-21 NOTE — Assessment & Plan Note (Signed)
Past history - normal immunoglobulin levels. Good response to pneumovax, positive tetanus/diptheria titers. Interim history - One antibiotics since the last visit. Keep track of infections and antibiotics use.

## 2022-06-21 NOTE — Assessment & Plan Note (Signed)
Past history - Peanuts caused hives and wheezing. Avoiding shellfish and tree nuts due to positive testing in the past. Tolerates shrimp. 2023 bloodwork positive to tree nuts, peanuts, shellfish, mollusks.  More likely to be anaphylactic to hazelnuts, walnuts, cashews, [place] nuts, peanuts. Continue strict avoidance of peanuts, tree nuts, shellfish, mollusks. For mild symptoms you can take over the counter antihistamines such as Benadryl and monitor symptoms closely. If symptoms worsen or if you have severe symptoms including breathing issues, throat closure, significant swelling, whole body hives, severe diarrhea and vomiting, lightheadedness then inject epinephrine and seek immediate medical care afterwards. Emergency action plan in place.

## 2022-06-21 NOTE — Assessment & Plan Note (Signed)
Past history - Saw dermatology and was on [place] in the past. Stopped as it caused her skin to feel hypersensitive and swollen feet. Interim history - Flared on neck. Use Eucrisa (crisaborole) 2% ointment twice a day on mild rash flares on the face and body. This is a non-steroid ointment.  Use triamcinolone 0.1% ointment twice a day as needed for rash flares. Do not use on the face, neck, armpits or groin area. Do not use more than 3 weeks in a row.

## 2022-06-21 NOTE — Assessment & Plan Note (Signed)
Past history - Diagnosed with asthma over 20+ years ago. 5 courses of prednisone this year. Current smoker. Was on Dupixent for eczema but stopped due to feet swelling. 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 Clinically feeling improved. 06/25/2021 CXR no acute process.  2023 CT chest done. Started Berna Bue in June 2023 Interim history - had prednisone in January, currently symptoms flared. Started Tezspire in January 2024. Today's spirometry showed mixed obstructive and restrictive disease with 20% improvement in FEV1 post bronchodilator treatment. Clinically feeling slightly improved.  Continue Tezspire injection every 4 weeks.  Take prednisone '20mg'$  once a day for 5 days.  Quit smoking.  Letter written for work.  Daily controller medication(s): continue Breztri 2 puffs twice a day with spacer and rinse mouth afterwards.  Continue Singulair (montelukast) '10mg'$  daily at night. Start budesonide 0.'5mg'$  nebulizer once a day.  During respiratory infections/asthma flares:  Start budesonide 0.'5mg'$  nebulizer twice a day for 1-2 weeks until your breathing symptoms return to baseline.  Pretreat with albuterol 2 puffs or albuterol nebulizer.  If you need to use your albuterol nebulizer machine back to back within 15-30 minutes with no relief then please go to the ER/urgent care for further evaluation.  May use albuterol rescue inhaler 2 puffs or nebulizer every 4 to 6 hours as needed for shortness of breath, chest tightness, coughing, and wheezing. May use albuterol rescue inhaler 2 puffs 5 to 15 minutes prior to strenuous physical activities. Monitor frequency of use.  Get spirometry at next visit. If no improvement with Tezspire after 6 months - consider trying Xolair next.

## 2022-06-21 NOTE — Assessment & Plan Note (Signed)
Past history - 2023 Environmental allergy panel was positive to dust mites, cat, dog, grass, mold, trees, mouse, ragweed, weed pollen but IgE was very high (12K). Does not like Ryaltris. Interim history - has dogs at home.  Use over the counter antihistamines such as Zyrtec (cetirizine), Claritin (loratadine), Allegra (fexofenadine), or Xyzal (levocetirizine) daily as needed. May take twice a day during allergy flares. May switch antihistamines every few months. Continue Singulair (montelukast) '10mg'$  daily at night. Nasal saline spray (i.e., Simply Saline) or nasal saline lavage (i.e., NeilMed) is recommended as needed and prior to medicated nasal sprays. Continue environmental control measures.

## 2022-06-21 NOTE — Assessment & Plan Note (Signed)
Past history - Ige level over 12K. Denies skin abscesses, retained primary teeth, fractures, scoliosis, GI issues. Negative genetic testing for HIES. Elevated IgE most likely due to her eczema.

## 2022-06-21 NOTE — Assessment & Plan Note (Deleted)
Flared on neck. Use Eucrisa (crisaborole) 2% ointment twice a day on mild rash flares on the face and body. This is a non-steroid ointment.  Use triamcinolone 0.1% ointment twice a day as needed for rash flares. Do not use on the face, neck, armpits or groin area. Do not use more than 3 weeks in a row.

## 2022-07-06 ENCOUNTER — Ambulatory Visit (INDEPENDENT_AMBULATORY_CARE_PROVIDER_SITE_OTHER): Payer: Federal, State, Local not specified - PPO

## 2022-07-06 DIAGNOSIS — J4550 Severe persistent asthma, uncomplicated: Secondary | ICD-10-CM

## 2022-07-06 MED ORDER — TEZEPELUMAB-EKKO 210 MG/1.91ML ~~LOC~~ SOSY
210.0000 mg | PREFILLED_SYRINGE | Freq: Once | SUBCUTANEOUS | Status: AC
  Administered 2022-07-06: 210 mg via SUBCUTANEOUS

## 2022-07-20 ENCOUNTER — Ambulatory Visit: Payer: Federal, State, Local not specified - PPO | Admitting: Allergy

## 2022-08-03 ENCOUNTER — Ambulatory Visit: Payer: Federal, State, Local not specified - PPO

## 2022-08-03 DIAGNOSIS — J4540 Moderate persistent asthma, uncomplicated: Secondary | ICD-10-CM | POA: Diagnosis not present

## 2022-08-03 DIAGNOSIS — K2100 Gastro-esophageal reflux disease with esophagitis, without bleeding: Secondary | ICD-10-CM | POA: Diagnosis not present

## 2022-08-03 DIAGNOSIS — Z79899 Other long term (current) drug therapy: Secondary | ICD-10-CM | POA: Diagnosis not present

## 2022-08-03 DIAGNOSIS — L209 Atopic dermatitis, unspecified: Secondary | ICD-10-CM | POA: Diagnosis not present

## 2022-08-19 ENCOUNTER — Ambulatory Visit: Payer: Federal, State, Local not specified - PPO | Admitting: Allergy

## 2022-10-12 DIAGNOSIS — K2100 Gastro-esophageal reflux disease with esophagitis, without bleeding: Secondary | ICD-10-CM | POA: Diagnosis not present

## 2022-10-12 DIAGNOSIS — J4520 Mild intermittent asthma, uncomplicated: Secondary | ICD-10-CM | POA: Diagnosis not present

## 2022-10-12 DIAGNOSIS — L209 Atopic dermatitis, unspecified: Secondary | ICD-10-CM | POA: Diagnosis not present

## 2022-10-12 DIAGNOSIS — Z79899 Other long term (current) drug therapy: Secondary | ICD-10-CM | POA: Diagnosis not present

## 2022-10-19 DIAGNOSIS — M9903 Segmental and somatic dysfunction of lumbar region: Secondary | ICD-10-CM | POA: Diagnosis not present

## 2022-10-19 DIAGNOSIS — M9904 Segmental and somatic dysfunction of sacral region: Secondary | ICD-10-CM | POA: Diagnosis not present

## 2022-10-19 DIAGNOSIS — M9902 Segmental and somatic dysfunction of thoracic region: Secondary | ICD-10-CM | POA: Diagnosis not present

## 2022-10-19 DIAGNOSIS — M5417 Radiculopathy, lumbosacral region: Secondary | ICD-10-CM | POA: Diagnosis not present

## 2022-10-19 DIAGNOSIS — M62830 Muscle spasm of back: Secondary | ICD-10-CM | POA: Diagnosis not present

## 2022-10-25 DIAGNOSIS — M9902 Segmental and somatic dysfunction of thoracic region: Secondary | ICD-10-CM | POA: Diagnosis not present

## 2022-10-25 DIAGNOSIS — M5417 Radiculopathy, lumbosacral region: Secondary | ICD-10-CM | POA: Diagnosis not present

## 2022-10-25 DIAGNOSIS — M9903 Segmental and somatic dysfunction of lumbar region: Secondary | ICD-10-CM | POA: Diagnosis not present

## 2022-10-25 DIAGNOSIS — M62830 Muscle spasm of back: Secondary | ICD-10-CM | POA: Diagnosis not present

## 2022-10-25 DIAGNOSIS — M9904 Segmental and somatic dysfunction of sacral region: Secondary | ICD-10-CM | POA: Diagnosis not present

## 2022-11-02 DIAGNOSIS — M9904 Segmental and somatic dysfunction of sacral region: Secondary | ICD-10-CM | POA: Diagnosis not present

## 2022-11-02 DIAGNOSIS — M62830 Muscle spasm of back: Secondary | ICD-10-CM | POA: Diagnosis not present

## 2022-11-02 DIAGNOSIS — M5417 Radiculopathy, lumbosacral region: Secondary | ICD-10-CM | POA: Diagnosis not present

## 2022-11-02 DIAGNOSIS — M9903 Segmental and somatic dysfunction of lumbar region: Secondary | ICD-10-CM | POA: Diagnosis not present

## 2022-11-02 DIAGNOSIS — M9902 Segmental and somatic dysfunction of thoracic region: Secondary | ICD-10-CM | POA: Diagnosis not present

## 2022-11-16 DIAGNOSIS — M9904 Segmental and somatic dysfunction of sacral region: Secondary | ICD-10-CM | POA: Diagnosis not present

## 2022-11-16 DIAGNOSIS — M9902 Segmental and somatic dysfunction of thoracic region: Secondary | ICD-10-CM | POA: Diagnosis not present

## 2022-11-16 DIAGNOSIS — M62830 Muscle spasm of back: Secondary | ICD-10-CM | POA: Diagnosis not present

## 2022-11-16 DIAGNOSIS — M5417 Radiculopathy, lumbosacral region: Secondary | ICD-10-CM | POA: Diagnosis not present

## 2022-11-16 DIAGNOSIS — M9903 Segmental and somatic dysfunction of lumbar region: Secondary | ICD-10-CM | POA: Diagnosis not present

## 2022-12-14 DIAGNOSIS — M5417 Radiculopathy, lumbosacral region: Secondary | ICD-10-CM | POA: Diagnosis not present

## 2022-12-14 DIAGNOSIS — M9904 Segmental and somatic dysfunction of sacral region: Secondary | ICD-10-CM | POA: Diagnosis not present

## 2022-12-14 DIAGNOSIS — M9903 Segmental and somatic dysfunction of lumbar region: Secondary | ICD-10-CM | POA: Diagnosis not present

## 2022-12-14 DIAGNOSIS — M62830 Muscle spasm of back: Secondary | ICD-10-CM | POA: Diagnosis not present

## 2022-12-14 DIAGNOSIS — M9902 Segmental and somatic dysfunction of thoracic region: Secondary | ICD-10-CM | POA: Diagnosis not present

## 2022-12-28 DIAGNOSIS — Z01419 Encounter for gynecological examination (general) (routine) without abnormal findings: Secondary | ICD-10-CM | POA: Diagnosis not present

## 2023-01-13 DIAGNOSIS — J4540 Moderate persistent asthma, uncomplicated: Secondary | ICD-10-CM | POA: Diagnosis not present

## 2023-01-13 DIAGNOSIS — K2100 Gastro-esophageal reflux disease with esophagitis, without bleeding: Secondary | ICD-10-CM | POA: Diagnosis not present

## 2023-01-13 DIAGNOSIS — Z79899 Other long term (current) drug therapy: Secondary | ICD-10-CM | POA: Diagnosis not present

## 2023-01-13 DIAGNOSIS — L209 Atopic dermatitis, unspecified: Secondary | ICD-10-CM | POA: Diagnosis not present

## 2023-01-20 DIAGNOSIS — M5417 Radiculopathy, lumbosacral region: Secondary | ICD-10-CM | POA: Diagnosis not present

## 2023-01-20 DIAGNOSIS — M9902 Segmental and somatic dysfunction of thoracic region: Secondary | ICD-10-CM | POA: Diagnosis not present

## 2023-01-20 DIAGNOSIS — M9904 Segmental and somatic dysfunction of sacral region: Secondary | ICD-10-CM | POA: Diagnosis not present

## 2023-01-20 DIAGNOSIS — M9903 Segmental and somatic dysfunction of lumbar region: Secondary | ICD-10-CM | POA: Diagnosis not present

## 2023-01-20 DIAGNOSIS — M62830 Muscle spasm of back: Secondary | ICD-10-CM | POA: Diagnosis not present

## 2023-02-11 DIAGNOSIS — Z1231 Encounter for screening mammogram for malignant neoplasm of breast: Secondary | ICD-10-CM | POA: Diagnosis not present

## 2023-02-22 DIAGNOSIS — M9902 Segmental and somatic dysfunction of thoracic region: Secondary | ICD-10-CM | POA: Diagnosis not present

## 2023-02-22 DIAGNOSIS — M5417 Radiculopathy, lumbosacral region: Secondary | ICD-10-CM | POA: Diagnosis not present

## 2023-02-22 DIAGNOSIS — M9904 Segmental and somatic dysfunction of sacral region: Secondary | ICD-10-CM | POA: Diagnosis not present

## 2023-02-22 DIAGNOSIS — M9903 Segmental and somatic dysfunction of lumbar region: Secondary | ICD-10-CM | POA: Diagnosis not present

## 2023-03-11 DIAGNOSIS — M9903 Segmental and somatic dysfunction of lumbar region: Secondary | ICD-10-CM | POA: Diagnosis not present

## 2023-03-11 DIAGNOSIS — M9904 Segmental and somatic dysfunction of sacral region: Secondary | ICD-10-CM | POA: Diagnosis not present

## 2023-03-11 DIAGNOSIS — M9902 Segmental and somatic dysfunction of thoracic region: Secondary | ICD-10-CM | POA: Diagnosis not present

## 2023-03-11 DIAGNOSIS — M5417 Radiculopathy, lumbosacral region: Secondary | ICD-10-CM | POA: Diagnosis not present

## 2023-03-23 DIAGNOSIS — M9904 Segmental and somatic dysfunction of sacral region: Secondary | ICD-10-CM | POA: Diagnosis not present

## 2023-03-23 DIAGNOSIS — M9903 Segmental and somatic dysfunction of lumbar region: Secondary | ICD-10-CM | POA: Diagnosis not present

## 2023-03-23 DIAGNOSIS — M9902 Segmental and somatic dysfunction of thoracic region: Secondary | ICD-10-CM | POA: Diagnosis not present

## 2023-03-23 DIAGNOSIS — M5417 Radiculopathy, lumbosacral region: Secondary | ICD-10-CM | POA: Diagnosis not present

## 2023-04-13 NOTE — Progress Notes (Unsigned)
Follow Up Note  RE: Barbara Espinoza MRN: [id number] DOB: 1971/03/11 Date of Office Visit: 04/14/2023  Referring provider: Corine Shelter, MD Primary care provider: Corine Shelter, MD  Chief Complaint: No chief complaint on file.  History of Present Illness: I had the pleasure of seeing Barbara Espinoza for a follow up visit at the [facility] on 04/13/2023. She is a 52 y.o. female, who is being followed for asthma on Tezspire, allergic rhinitis, elevated IgE, food allergy, recurrent infections, AD. Her previous allergy office visit was on 06/21/2022 with Dr. Selena Batten. Today is a regular follow up visit.  Discussed the use of AI scribe software for clinical note transcription with the patient, who gave verbal consent to proceed.  History of Present Illness            Asthma, not well controlled Past history - Diagnosed with asthma over 20+ years ago. 5 courses of prednisone this year. Current smoker. Was on Dupixent for eczema but stopped due to feet swelling. 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 Clinically feeling improved. 06/25/2021 CXR no acute process.  2023 CT chest done. Started Harrington Challenger in June 2023 Interim history - had prednisone in January, currently symptoms flared. Started Tezspire in January 2024. Today's spirometry showed mixed obstructive and restrictive disease with 20% improvement in FEV1 post bronchodilator treatment. Clinically feeling slightly improved.  Continue Tezspire injection every 4 weeks.  Take prednisone 20mg  once a day for 5 days.  Quit smoking.  Letter written for work.  Daily controller medication(s): continue Breztri 2 puffs twice a day with spacer and rinse mouth afterwards.  Continue Singulair (montelukast) 10mg  daily at night. Start budesonide 0.5mg  nebulizer once a day.  During respiratory infections/asthma flares:  Start budesonide  0.5mg  nebulizer twice a day for 1-2 weeks until your breathing symptoms return to baseline.  Pretreat with albuterol 2 puffs or albuterol nebulizer.  If you need to use your albuterol nebulizer machine back to back within 15-30 minutes with no relief then please go to the ER/urgent care for further evaluation.  May use albuterol rescue inhaler 2 puffs or nebulizer every 4 to 6 hours as needed for shortness of breath, chest tightness, coughing, and wheezing. May use albuterol rescue inhaler 2 puffs 5 to 15 minutes prior to strenuous physical activities. Monitor frequency of use.  Get spirometry at next visit. If no improvement with Tezspire after 6 months - consider trying Xolair next.    Other allergic rhinitis Past history - 2023 Environmental allergy panel was positive to dust mites, cat, dog, grass, mold, trees, mouse, ragweed, weed pollen but IgE was very high (12K). Does not like Ryaltris. Interim history - has dogs at home.  Use over the counter antihistamines such as Zyrtec (cetirizine), Claritin (loratadine), Allegra (fexofenadine), or Xyzal (levocetirizine) daily as needed. May take twice a day during allergy flares. May switch antihistamines every few months. Continue Singulair (montelukast) 10mg  daily at night. Nasal saline spray (i.e., Simply Saline) or nasal saline lavage (i.e., NeilMed) is recommended as needed and prior to medicated nasal sprays. Continue environmental control measures.    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 Denies skin abscesses, retained primary teeth, fractures, scoliosis, GI issues. Negative genetic testing for HIES. Elevated IgE most likely due to her eczema.   Food allergy Past history - Peanuts caused hives and wheezing. Avoiding shellfish and tree nuts due to positive testing in  the past. Tolerates shrimp. 2023 bloodwork positive to tree nuts, peanuts, shellfish, mollusks.  More likely to be anaphylactic to hazelnuts, walnuts, cashews,  [place] nuts, peanuts. Continue strict avoidance of peanuts, tree nuts, shellfish, mollusks. For mild symptoms you can take over the counter antihistamines such as Benadryl and monitor symptoms closely. If symptoms worsen or if you have severe symptoms including breathing issues, throat closure, significant swelling, whole body hives, severe diarrhea and vomiting, lightheadedness then inject epinephrine and seek immediate medical care afterwards. Emergency action plan in place.    Recurrent infections Past history - normal immunoglobulin levels. Good response to pneumovax, positive tetanus/diptheria titers. Interim history - One antibiotics since the last visit. Keep track of infections and antibiotics use.    Other atopic dermatitis Past history - Saw dermatology and was on Dupixent in the past. Stopped as it caused her skin to feel hypersensitive and swollen feet. Interim history - Flared on neck. Use Eucrisa (crisaborole) 2% ointment twice a day on mild rash flares on the face and body. This is a non-steroid ointment.  Use triamcinolone 0.1% ointment twice a day as needed for rash flares. Do not use on the face, neck, armpits or groin area. Do not use more than 3 weeks in a row.  Assessment and Plan: Barbara Espinoza is a 52 y.o. female with: *** Assessment and Plan              No follow-ups on file.  No orders of the defined types were placed in this encounter.  Lab Orders  No laboratory test(s) ordered today    Diagnostics: Spirometry:  Tracings reviewed. Her effort: {Blank single:19197::"Good reproducible efforts.","It was hard to get consistent efforts and there is a question as to whether this reflects a maximal maneuver.","Poor effort, data can not be interpreted."} FVC: ***L FEV1: ***L, ***% predicted FEV1/FVC ratio: ***% Interpretation: {Blank single:19197::"Spirometry consistent with mild obstructive disease","Spirometry consistent with moderate obstructive  disease","Spirometry consistent with severe obstructive disease","Spirometry consistent with possible restrictive disease","Spirometry consistent with mixed obstructive and restrictive disease","Spirometry uninterpretable due to technique","Spirometry consistent with normal pattern","No overt abnormalities noted given today's efforts"}.  Please see scanned spirometry results for details.  Skin Testing: {Blank single:19197::"Select foods","Environmental allergy panel","Environmental allergy panel and select foods","Food allergy panel","None","Deferred due to recent antihistamines use"}. *** Results discussed with patient/family.   Medication List:  Current Outpatient Medications  Medication Sig Dispense Refill   albuterol (PROAIR HFA) 108 (90 Base) MCG/ACT inhaler Inhale 2 puffs into the lungs every 4 (four) hours as needed for wheezing or shortness of breath (coughing fits). 18 g 1   albuterol (PROVENTIL HFA;VENTOLIN HFA) 108 (90 BASE) MCG/ACT inhaler Inhale 2 puffs into the lungs every 4 (four) hours as needed. Shortness of breath and wheezing      albuterol (PROVENTIL) (2.5 MG/3ML) 0.083% nebulizer solution Take 2.5 mg by nebulization every 6 (six) hours as needed. Shortness of breath and wheezing      Budeson-Glycopyrrol-Formoterol (BREZTRI AEROSPHERE) 160-9-4.8 MCG/ACT AERO Inhale 2 puffs into the lungs in the morning and at bedtime. with spacer and rinse mouth afterwards. 10.7 g 3   budesonide (PULMICORT) 0.5 MG/2ML nebulizer solution INHALE 2 MLS BY NEBULIZATION IN THE MORNING AND AT BEDTIME. 360 mL 0   Crisaborole (EUCRISA) 2 % OINT Apply 1 Application topically 2 (two) times daily as needed (mild rash). 100 g 5   dexamethasone (DECADRON) 4 MG tablet Take 4 mg by mouth as needed.     EPINEPHrine 0.3 mg/0.3 mL IJ SOAJ injection Inject  0.3 mg into the muscle as needed for anaphylaxis. 1 each 2   escitalopram (LEXAPRO) 10 MG tablet Take 10 mg by mouth daily.     levalbuterol (XOPENEX) 0.63  MG/3ML nebulizer solution Take by nebulization every 6 (six) hours.     montelukast (SINGULAIR) 10 MG tablet Take 1 tablet (10 mg total) by mouth at bedtime. 30 tablet 5   Multiple Vitamins-Minerals (HAIR SKIN AND NAILS FORMULA) TABS Take by mouth.     olopatadine (PATANOL) 0.1 % ophthalmic solution 1-2 drops 2 (two) times daily.     omeprazole (PRILOSEC) 20 MG capsule Take 1 capsule (20 mg total) by mouth daily. 30 capsule 1   predniSONE (DELTASONE) 20 MG tablet Take 1 tablet (20 mg total) by mouth daily with breakfast. 5 tablet 0   TEZSPIRE 210 MG/1. SOAJ Inject into the skin.     triamcinolone ointment (KENALOG) 0.1 % Apply 1 application topically daily. Use as a moisturizer. 453 g 3   No current facility-administered medications for this visit.   Allergies: Allergies  Allergen Reactions   Peanut-Containing Drug Products Hives    "NUTS" Reaction: wheezing   Shellfish Allergy Hives    Reaction: wheezing   I reviewed her past medical history, social history, family history, and environmental history and no significant changes have been reported from her previous visit.  Review of Systems  Constitutional:  Negative for appetite change, chills, fever and unexpected weight change.  HENT:  Negative for congestion and rhinorrhea.   Respiratory:  Positive for cough, chest tightness, shortness of breath and wheezing.   Cardiovascular:  Negative for chest pain.  Gastrointestinal:  Negative for abdominal pain.  Genitourinary:  Negative for difficulty urinating.  Skin:  Positive for rash.  Allergic/Immunologic: Positive for environmental allergies and food allergies.  Neurological:  Negative for headaches.    Objective: There were no vitals taken for this visit. There is no height or weight on file to calculate BMI. Physical Exam Vitals and nursing note reviewed.  Constitutional:      Appearance: Normal appearance. She is well-developed.  HENT:     Head: Normocephalic and  atraumatic.     Right Ear: Tympanic membrane and external ear normal.     Left Ear: Tympanic membrane and external ear normal.     Nose: Nose normal.     Mouth/Throat:     Mouth: Mucous membranes are moist.     Pharynx: Oropharynx is clear.  Eyes:     Conjunctiva/sclera: Conjunctivae normal.  Cardiovascular:     Rate and Rhythm: Normal rate and regular rhythm.     Heart sounds: Normal heart sounds. No murmur heard. Pulmonary:     Effort: Pulmonary effort is normal.     Breath sounds: Wheezing present. No rhonchi or rales.     Comments: Wheezing still present after bronchodilator treatment.  Musculoskeletal:     Cervical back: Neck supple.  Skin:    General: Skin is warm and dry.     Findings: Rash present.     Comments: Eczematous patches on the neck.  Neurological:     Mental Status: She is alert and oriented to person, place, and time.  Psychiatric:        Behavior: Behavior normal.    Previous notes and tests were reviewed. The plan was reviewed with the patient/family, and all questions/concerned were addressed.  It was my pleasure to see Barbara Espinoza today and participate in her care. Please feel free to contact me with any questions  or concerns.  Sincerely,  Wyline Mood, DO Allergy & Immunology  [facility] office: [phone] [facility] office: [phone]

## 2023-04-14 ENCOUNTER — Ambulatory Visit: Payer: Federal, State, Local not specified - PPO | Admitting: Allergy

## 2023-04-14 ENCOUNTER — Encounter: Payer: Self-pay | Admitting: Allergy

## 2023-04-14 ENCOUNTER — Other Ambulatory Visit: Payer: Self-pay

## 2023-04-14 VITALS — BP 134/76 | HR 56 | Temp 98.3°F | Resp 16 | Ht 65.0 in | Wt 210.0 lb

## 2023-04-14 DIAGNOSIS — J3089 Other allergic rhinitis: Secondary | ICD-10-CM

## 2023-04-14 DIAGNOSIS — R768 Other specified abnormal immunological findings in serum: Secondary | ICD-10-CM

## 2023-04-14 DIAGNOSIS — L209 Atopic dermatitis, unspecified: Secondary | ICD-10-CM | POA: Diagnosis not present

## 2023-04-14 DIAGNOSIS — L2089 Other atopic dermatitis: Secondary | ICD-10-CM

## 2023-04-14 DIAGNOSIS — B999 Unspecified infectious disease: Secondary | ICD-10-CM

## 2023-04-14 DIAGNOSIS — K2100 Gastro-esophageal reflux disease with esophagitis, without bleeding: Secondary | ICD-10-CM | POA: Diagnosis not present

## 2023-04-14 DIAGNOSIS — J4550 Severe persistent asthma, uncomplicated: Secondary | ICD-10-CM

## 2023-04-14 DIAGNOSIS — L301 Dyshidrosis [pompholyx]: Secondary | ICD-10-CM

## 2023-04-14 DIAGNOSIS — T7800XD Anaphylactic reaction due to unspecified food, subsequent encounter: Secondary | ICD-10-CM

## 2023-04-14 MED ORDER — MOMETASONE FUROATE 0.1 % EX OINT: TOPICAL_OINTMENT | Freq: Every day | CUTANEOUS | 2 refills | Status: AC | PRN

## 2023-04-14 MED ORDER — PIMECROLIMUS 1 % EX CREA: TOPICAL_CREAM | Freq: Two times a day (BID) | CUTANEOUS | 2 refills | Status: AC | PRN

## 2023-04-14 MED ORDER — EPINEPHRINE 0.3 MG/0.3ML IJ SOAJ: 0.3000 mg | INTRAMUSCULAR | 2 refills | Status: AC | PRN

## 2023-04-14 MED ORDER — EPICERAM EX EMUL: CUTANEOUS | 3 refills | Status: AC

## 2023-04-14 MED ORDER — EUCRISA 2 % EX OINT: 1.0000 | TOPICAL_OINTMENT | Freq: Two times a day (BID) | CUTANEOUS | 5 refills | Status: AC | PRN

## 2023-04-14 NOTE — Patient Instructions (Addendum)
Asthma:  Try to quit smoking.  Continue Tezspire injection every 4 weeks.  Daily controller medication(s): start Trelegy 1 puff once a day and rinse mouth after each use. Sample given and demonstrated proper use.   Let me know if this works just as well as Clinical cytogeneticist. Continue Singulair (montelukast) 10mg  daily at night. During respiratory infections/asthma flares:  Start budesonide 0.5mg  nebulizer twice a day for 1-2 weeks until your breathing symptoms return to baseline.  Pretreat with albuterol 2 puffs or albuterol nebulizer.  If you need to use your albuterol nebulizer machine back to back within 15-30 minutes with no relief then please go to the ER/urgent care for further evaluation.  May use albuterol rescue inhaler 2 puffs or nebulizer every 4 to 6 hours as needed for shortness of breath, chest tightness, coughing, and wheezing. May use albuterol rescue inhaler 2 puffs 5 to 15 minutes prior to strenuous physical activities. Monitor frequency of use - if you need to use it more than twice per week on a consistent basis let us know.  Asthma control goals:  Full participation in all desired activities (may need albuterol before activity) Albuterol use two times or less a week on average (not counting use with activity) Cough interfering with sleep two times or less a month Oral steroids no more than once a year No hospitalizations  Eczema: Use for the hands: Use mometasone 0.1% cream once a day as needed for rash flares. Do not use on the face, neck, armpits or groin area. Do not use more than 1 week in a row.  Use for the face: Use Elidel (pimecrolimus) 0.1% cream twice a day as needed for rash flares.  Do not use more than 2 weeks in a row.   Use Eucrisa (crisaborole) 2% ointment twice a day on mild rash flares on the face and body. This is a non-steroid ointment.  If it burns, place the medication in the refrigerator.  Apply a thin layer of moisturizer and then apply the Eucrisa  on top of it.  Sending in a prescription lotion called epicerum - use twice a day as a moisturizer. Samples given.  If this works well for you, then have [facility] ship the medication to your home - prescription already sent in.  If not covered then use over the counter moisturizers as below.  Infections Keep track of infections and antibiotics use.  Rhinitis:  Use over the counter antihistamines such as Zyrtec (cetirizine), Claritin (loratadine), Allegra (fexofenadine), or Xyzal (levocetirizine) daily as needed. May take twice a day during allergy flares. May switch antihistamines every few months. Continue Singulair (montelukast) 10mg  daily at night. Nasal saline spray (i.e., Simply Saline) or nasal saline lavage (i.e., NeilMed) is recommended as needed and prior to medicated nasal sprays. Continue environmental control measures.   Food allergies Continue strict avoidance of peanuts, tree nuts, shellfish, mollusks. For mild symptoms you can take over the counter antihistamines such as Benadryl and monitor symptoms closely. If symptoms worsen or if you have severe symptoms including breathing issues, throat closure, significant swelling, whole body hives, severe diarrhea and vomiting, lightheadedness then inject epinephrine and seek immediate medical care afterwards. Emergency action plan in place.   Follow up in 2-3 months or sooner if needed.  Skin care recommendations  Bath time: Always use lukewarm water. AVOID very hot or cold water. Keep bathing time to 5-10 minutes. Do NOT use bubble bath. Use a mild soap and use just enough to wash the dirty  areas. Do NOT scrub skin vigorously.  After bathing, pat dry your skin with a towel. Do NOT rub or scrub the skin.  Moisturizers and prescriptions:  ALWAYS apply moisturizers immediately after bathing (within 3 minutes). This helps to lock-in moisture. Use the moisturizer several times a day over the whole body. Good summer moisturizers  include: Aveeno, CeraVe, Cetaphil. Good winter moisturizers include: Aquaphor, Vaseline, Cerave, Cetaphil, Eucerin, Vanicream. When using moisturizers along with medications, the moisturizer should be applied about one hour after applying the medication to prevent diluting effect of the medication or moisturize around where you applied the medications. When not using medications, the moisturizer can be continued twice daily as maintenance.  Laundry and clothing: Avoid laundry products with added color or perfumes. Use unscented hypo-allergenic laundry products such as Tide free, Cheer free & gentle, and All free and clear.  If the skin still seems dry or sensitive, you can try double-rinsing the clothes. Avoid tight or scratchy clothing such as wool. Do not use fabric softeners or dyer sheets.

## 2023-04-20 DIAGNOSIS — M9904 Segmental and somatic dysfunction of sacral region: Secondary | ICD-10-CM | POA: Diagnosis not present

## 2023-04-20 DIAGNOSIS — M9902 Segmental and somatic dysfunction of thoracic region: Secondary | ICD-10-CM | POA: Diagnosis not present

## 2023-04-20 DIAGNOSIS — M9903 Segmental and somatic dysfunction of lumbar region: Secondary | ICD-10-CM | POA: Diagnosis not present

## 2023-04-20 DIAGNOSIS — M5417 Radiculopathy, lumbosacral region: Secondary | ICD-10-CM | POA: Diagnosis not present

## 2023-04-26 ENCOUNTER — Other Ambulatory Visit: Payer: Self-pay | Admitting: Allergy

## 2023-05-04 DIAGNOSIS — M9902 Segmental and somatic dysfunction of thoracic region: Secondary | ICD-10-CM | POA: Diagnosis not present

## 2023-05-04 DIAGNOSIS — M9904 Segmental and somatic dysfunction of sacral region: Secondary | ICD-10-CM | POA: Diagnosis not present

## 2023-05-04 DIAGNOSIS — M5417 Radiculopathy, lumbosacral region: Secondary | ICD-10-CM | POA: Diagnosis not present

## 2023-05-04 DIAGNOSIS — M9903 Segmental and somatic dysfunction of lumbar region: Secondary | ICD-10-CM | POA: Diagnosis not present

## 2023-06-01 DIAGNOSIS — M9904 Segmental and somatic dysfunction of sacral region: Secondary | ICD-10-CM | POA: Diagnosis not present

## 2023-06-01 DIAGNOSIS — M9902 Segmental and somatic dysfunction of thoracic region: Secondary | ICD-10-CM | POA: Diagnosis not present

## 2023-06-01 DIAGNOSIS — M9903 Segmental and somatic dysfunction of lumbar region: Secondary | ICD-10-CM | POA: Diagnosis not present

## 2023-06-01 DIAGNOSIS — M5417 Radiculopathy, lumbosacral region: Secondary | ICD-10-CM | POA: Diagnosis not present

## 2023-08-11 DIAGNOSIS — L209 Atopic dermatitis, unspecified: Secondary | ICD-10-CM | POA: Diagnosis not present

## 2023-08-11 DIAGNOSIS — Z79899 Other long term (current) drug therapy: Secondary | ICD-10-CM | POA: Diagnosis not present

## 2023-08-11 DIAGNOSIS — K2100 Gastro-esophageal reflux disease with esophagitis, without bleeding: Secondary | ICD-10-CM | POA: Diagnosis not present

## 2023-08-11 DIAGNOSIS — J4540 Moderate persistent asthma, uncomplicated: Secondary | ICD-10-CM | POA: Diagnosis not present

## 2023-08-17 DIAGNOSIS — M9903 Segmental and somatic dysfunction of lumbar region: Secondary | ICD-10-CM | POA: Diagnosis not present

## 2023-08-17 DIAGNOSIS — M5417 Radiculopathy, lumbosacral region: Secondary | ICD-10-CM | POA: Diagnosis not present

## 2023-08-17 DIAGNOSIS — M9904 Segmental and somatic dysfunction of sacral region: Secondary | ICD-10-CM | POA: Diagnosis not present

## 2023-08-17 DIAGNOSIS — M9902 Segmental and somatic dysfunction of thoracic region: Secondary | ICD-10-CM | POA: Diagnosis not present

## 2023-09-01 DIAGNOSIS — K080 Exfoliation of teeth due to systemic causes: Secondary | ICD-10-CM | POA: Diagnosis not present

## 2023-09-21 DIAGNOSIS — M5417 Radiculopathy, lumbosacral region: Secondary | ICD-10-CM | POA: Diagnosis not present

## 2023-09-21 DIAGNOSIS — M9904 Segmental and somatic dysfunction of sacral region: Secondary | ICD-10-CM | POA: Diagnosis not present

## 2023-09-21 DIAGNOSIS — M9902 Segmental and somatic dysfunction of thoracic region: Secondary | ICD-10-CM | POA: Diagnosis not present

## 2023-09-21 DIAGNOSIS — M9903 Segmental and somatic dysfunction of lumbar region: Secondary | ICD-10-CM | POA: Diagnosis not present

## 2023-11-02 DIAGNOSIS — M9904 Segmental and somatic dysfunction of sacral region: Secondary | ICD-10-CM | POA: Diagnosis not present

## 2023-11-02 DIAGNOSIS — M5417 Radiculopathy, lumbosacral region: Secondary | ICD-10-CM | POA: Diagnosis not present

## 2023-11-02 DIAGNOSIS — M9903 Segmental and somatic dysfunction of lumbar region: Secondary | ICD-10-CM | POA: Diagnosis not present

## 2023-11-02 DIAGNOSIS — M9902 Segmental and somatic dysfunction of thoracic region: Secondary | ICD-10-CM | POA: Diagnosis not present

## 2023-11-22 DIAGNOSIS — J189 Pneumonia, unspecified organism: Secondary | ICD-10-CM | POA: Diagnosis not present

## 2023-11-22 DIAGNOSIS — J45901 Unspecified asthma with (acute) exacerbation: Secondary | ICD-10-CM | POA: Diagnosis not present

## 2023-11-22 DIAGNOSIS — R062 Wheezing: Secondary | ICD-10-CM | POA: Diagnosis not present

## 2023-11-22 DIAGNOSIS — F17200 Nicotine dependence, unspecified, uncomplicated: Secondary | ICD-10-CM | POA: Diagnosis not present

## 2023-12-08 DIAGNOSIS — L209 Atopic dermatitis, unspecified: Secondary | ICD-10-CM | POA: Diagnosis not present

## 2023-12-08 DIAGNOSIS — K2100 Gastro-esophageal reflux disease with esophagitis, without bleeding: Secondary | ICD-10-CM | POA: Diagnosis not present

## 2023-12-08 DIAGNOSIS — J4540 Moderate persistent asthma, uncomplicated: Secondary | ICD-10-CM | POA: Diagnosis not present

## 2023-12-08 DIAGNOSIS — Z79899 Other long term (current) drug therapy: Secondary | ICD-10-CM | POA: Diagnosis not present

## 2023-12-21 DIAGNOSIS — J45998 Other asthma: Secondary | ICD-10-CM | POA: Diagnosis not present

## 2023-12-21 DIAGNOSIS — Z800 Family history of malignant neoplasm of digestive organs: Secondary | ICD-10-CM | POA: Diagnosis not present

## 2023-12-21 DIAGNOSIS — Z1211 Encounter for screening for malignant neoplasm of colon: Secondary | ICD-10-CM | POA: Diagnosis not present

## 2024-02-17 DIAGNOSIS — Z1231 Encounter for screening mammogram for malignant neoplasm of breast: Secondary | ICD-10-CM | POA: Diagnosis not present

## 2024-03-01 DIAGNOSIS — J4540 Moderate persistent asthma, uncomplicated: Secondary | ICD-10-CM | POA: Diagnosis not present

## 2024-03-01 DIAGNOSIS — Z79899 Other long term (current) drug therapy: Secondary | ICD-10-CM | POA: Diagnosis not present

## 2024-03-01 DIAGNOSIS — L209 Atopic dermatitis, unspecified: Secondary | ICD-10-CM | POA: Diagnosis not present

## 2024-03-01 DIAGNOSIS — K2100 Gastro-esophageal reflux disease with esophagitis, without bleeding: Secondary | ICD-10-CM | POA: Diagnosis not present

## 2024-03-15 DIAGNOSIS — Z01419 Encounter for gynecological examination (general) (routine) without abnormal findings: Secondary | ICD-10-CM | POA: Diagnosis not present

## 2024-04-01 NOTE — ED Provider Notes (Signed)
 History   Chief Complaint  Patient presents with  . Near Syncope    Pt presents to er from party after pt states she was by the dancefloor and felt hot and light headed. Pt had a near syncopal episode with n/v. Pt denies any pain or alcohol intake    Barbara Espinoza is a 53 y.o. female who has no past medical history on file.       History provided by:  Patient and friend I obtained collateral history from an independent historian. Information gathered has been integrated into narrative::  Close contact of pt (collateral info) I reviewed the following info and integrated  pertinent info in narrative::  Patient's medical records (non-[facility] ED/UC) Dizziness Timing/Onset:  Acute Frequency:  Once Progression:  Unchanged Severity of symptoms:  Moderate Associated with: nothing   Relived by:  Nothing Exacerbating factors:  Nothing Associated symptoms: no chills, no fatigue, no fever and no headaches     History reviewed. No pertinent past medical history.  History reviewed. No pertinent surgical history.  History reviewed. No pertinent family history.  Social History   Social History Narrative  . Not on file    Social History   Tobacco Use  . Smoking status: Unknown    Review of Systems  Constitutional:  Negative for chills, fatigue and fever.  HENT:  Negative for sore throat.   Eyes:  Negative for pain and visual disturbance.  Respiratory:  Negative for cough, chest tightness and shortness of breath.   Cardiovascular:  Negative for chest pain and palpitations.  Gastrointestinal:  Positive for nausea, vomiting and diarrhea. Negative for abdominal pain and constipation.  Genitourinary:  Negative for dysuria, hematuria, vaginal bleeding and vaginal discharge.  Musculoskeletal:  Negative for arthralgias, back pain, myalgias, neck pain and neck stiffness.  Skin:  Negative for rash.  Neurological:  Positive for dizziness. Negative for seizures, weakness, numbness and  headaches.       Near syncope  Psychiatric/Behavioral:  Negative for agitation, confusion and suicidal ideas.       Physical Exam  Initial ED Vital Signs: Blood Pressure: (!) 135/107  Heart Rate: 62  Temp: 99 F (37.2 C) Resp: 18 SpO2: 99 %    Last Documented Vital Signs This Encounter: Blood Pressure: 142/79 MAP (mmHg): 97 Heart Rate: 85 Temp: 99 F (37.2 C)  Resp: 18  SpO2: 96 % Height: 5' 4 (162.6 cm) Weight: 90.7 kg (200 lb)   height is 5' 4 (1.626 m) and weight is 90.7 kg (200 lb). Her temperature is 99 F (37.2 C). Her blood pressure is 142/79 and her pulse is 85. Her respiration is 18 and oxygen saturation is 96%.    Physical Exam Vitals and nursing note reviewed.  Constitutional:      General: She is not in acute distress.    Appearance: She is well-developed.  HENT:     Head: Normocephalic.     Right Ear: External ear normal.     Left Ear: External ear normal.     Mouth/Throat:     Mouth: Mucous membranes are moist.  Eyes:     Extraocular Movements: Extraocular movements intact.     Conjunctiva/sclera: Conjunctivae normal.     Pupils: Pupils are equal, round, and reactive to light.  Cardiovascular:     Rate and Rhythm: Normal rate and regular rhythm.     Heart sounds: Normal heart sounds. No murmur heard. Pulmonary:     Effort: Pulmonary effort is normal. No respiratory  distress.     Breath sounds: Normal breath sounds. No wheezing.  Abdominal:     General: Bowel sounds are normal. There is no distension.     Palpations: Abdomen is soft.     Tenderness: There is no abdominal tenderness. There is no guarding or rebound.  Musculoskeletal:        General: No tenderness or deformity. Normal range of motion.     Cervical back: Normal range of motion.  Lymphadenopathy:     Cervical: No cervical adenopathy.  Skin:    General: Skin is warm and dry.     Findings: No erythema or rash.  Neurological:     General: No focal deficit present.     Mental  Status: She is alert and oriented to person, place, and time.     Cranial Nerves: No cranial nerve deficit.                     ED Course   Orders Placed This Encounter  . Urine culture  . XR Chest 1 View Portable  . CT Head Without Contrast  . Rainbow Draw  . XTRA BLUE TUBE  . XTRA LAVENDER TUBE  . XTRA SST TUBE  . XTRA GREEN TUBE  . CBC and Differential  . Comprehensive Metabolic Panel  . Magnesium  . Prothrombin Time and Activated Partial Thromboplastin Time (PT/PTT)  . High Sensitivity Troponin  . Ethanol  . Urinalysis  . Drugs of Abuse Profile, Urine  . PT/INR  . APTT (PTT)  . ECG 12 lead  . 0.9% sodium chloride  bolus 1,000 mL  . ondansetron  (ZOFRAN ) injection 4 mg  . ondansetron  (ZOFRAN  ODT) 4 mg dispersible tablet    Procedures  CT Head Without Contrast  Final Result    1. No CT evidence of acute intracranial abnormality.    XR Chest 1 View Portable  Final Result    1. Negative chest x-ray.      Labs  CBC AND DIFFERENTIAL - Abnormal      Result Value   WBC 8.6     RBC 4.19 (*)    HGB 13.5     HCT 42.4     MCV 101.2 (*)    MCH 32.2 (*)    MCHC 31.8 (*)    RDW 14.4     RDW (SD) 53.4 (*)    PLT 270     MPV 9.7     Nucleated RBC, Automated <2     % Neutrophils 78.6 (*)    % Lymphocytes 11.6 (*)    % Monocytes 7.2     % Eosinophils 1.9     % Basophils 0.1     % Immature Granulocytes 0.6     Abs Neutrophils, Automated 6.79     Abs Lymphocytes 1.00     Abs Monocytes 0.62     Abs Eosinophils 0.16     Abs Basophils 0.01     Abs Immature Granulocytes 0.05    COMPREHENSIVE METABOLIC PANEL - Abnormal   Glucose 104 (*)    Sodium 144     Potassium 3.5     Chloride 109     CO2 27     Anion Gap 8     BUN 11     Creatinine 1.1     Calcium 9.7     Bilirubin Total 0.6     Total Protein 7.8     Albumin 3.8     AST (SGOT)  29     ALT (SGPT) 30     Alkaline Phosphatase 119 (*)    eGFR 59.8 (*)   URINALYSIS - Abnormal   Color Amber      Clarity, Urine Cloudy     Glucose, Urine Negative     Bilirubin, Urine Positive (*)    Ketones, Urine Negative     SG, Urine 1.028     Blood, Urine Negative     pH, Urine 5.0     Protein, Urine Moderate (*)    Urobilinogen, Urine Positive (*)    Nitrite, Urine Negative     Leukocyte Esterase, Urine Positive (*)    RBC, Urine 14 (*)    WBC, Urine 14 (*)    Squamous Epithelial, Urine 42 (*)    Mucous, Urine Present     Bacteria, Urine Present (*)    Hyaline Casts, Urine 17 (*)    Budding Yeast, Urine 2+ (*)    Narrative:    Ascorbic acid detected in Urinalysis.  Ascorbic acid can cause false-negative Chemistry results for Glucose, RBC, and WBC.  Interpret with caution. Dipstick analysis performed by automated method.  ACTIVATED PARTIAL THROMBOPLASTIN TIME - Abnormal   Activated Partial Thromboplastin Time (aPTT) 23.8 (*)   MAGNESIUM - Normal   Magnesium 2.1    HIGH SENSITIVITY TROPONIN - Normal   Troponin, High Sensitive 13.0    ETHANOL - Normal   Alcohol, Blood <0.003    PROTHROMBIN TIME-INR - Normal   Prothrombin Time 10.2     INR 0.9    URINE CULTURE  PROTHROMBIN TIME AND ACTIVATED PARTIAL THROMBOPLASTIN TIME   Narrative:    The following orders were created for panel order Prothrombin Time and Activated Partial Thromboplastin Time (PT/PTT). Procedure                               Abnormality         Status                    ---------                               -----------         ------                    EU/PWM[[id number]]                       Normal              Final result              APTT (EUU)[[id number]]                   Abnormal            Final result               Please view results for these tests on the individual orders.  DRUGS OF ABUSE PROFILE   Amphetamines, Urine Negative     Barbiturates, Urine Negative     Benzodiazepine, Urine Negative     Cannabinoids, Urine Positive     Cocaine, Urine Negative     Opiates, Urine Negative     PCP, Urine  Negative     Creatinine Urine 455.2     Fentanyl , Urine Negative  Narrative:    DAU Threshold/Cutoff Values  Amphetamine           >=1000 ng/mL Barbiturate           >=200  ng/mL Benzodiazepine        >=200  ng/mL Cocaine               >=300  ng/mL Cannabinoid (THC)     >=50   ng/mL Opiate                >=300  ng/mL Phencyclidine (PCP)   >=25   ng/mL Fentanyl               >=1    ng/mL  Screening result is unconfirmed and should be used for medical purposes only.  Unconfirmed screening results must not be used for non-medical purposes.  If the urine creatinine is <2.0, the validity of the specimen is in question due to possible dilution and/or substitution.     All Medication Administration through 04/01/2024 9175       Date/Time Order Dose Route Action Action by    04/01/2024 0421 EST 0.9% sodium chloride  bolus 1,000 mL 0 mL Intravenous Parthenia Marvis Tatiana JONELLE, RN    04/01/2024 0039 EST 0.9% sodium chloride  bolus 1,000 mL 1,000 mL Intravenous [address] Marvis Tatiana JONELLE, RN    04/01/2024 0039 EST ondansetron  (ZOFRAN ) injection 4 mg 4 mg Intravenous Given Marvis Tatiana JONELLE, RN       ED Discharge Orders (From admission, onward)    None        ED Prescriptions     Medication Sig Dispense Start Date End Date Auth. Provider   ondansetron  (ZOFRAN  ODT) 4 mg dispersible tablet Take 1 tablet (4 mg total) by mouth every 6 (six) hours as needed for nausea or vomiting. 20 tablet 04/01/2024 -- Carvel Raisin A., DO       Diagnosis:   Final diagnoses:  Dizziness (Primary)  Near syncope  Nausea and vomiting     Follow Up Instructions:   schedule a follow up appointment with your primary care physician immediately Call  Follow up with your PCP immediately, Return to the ER if symptoms worsen    Disposition: Discharge Condition:  Stable         [facility] ED MDM 2023 :   MDM Narrative:      Primary care office note from 04/14/23 for asthma reviewed by me  I independently  interpreted::      My independent interpretation of the Xray: see narrative      My independent interpretation of the CT scan: see narrative      My independent interpretation of the EKG: no STEMI   Other::      I do not believe that this patient has sepsis.      I have reassessed the patient's hemodynamic status.      Patient presented to the emergency department tonight after having an episode of dizziness and near syncope.  She states she was at a house party where she got very hot/dizzy and feel like she was going to pass out.  She denies any loss of consciousness.  Patient received 1 L IV fluids in the ER.  Labs unremarkable including negative troponin.  UA appears to be a dirty sample.  Patient denies any urinary symptoms and therefore no treatment at this time we will wait for culture.  Urine drug screen positive for cannabis with patient admits to using THC Gummies at  times.  CT head showed no acute intracranial process.  Chest x-ray unremarkable.  Lab and imaging results were discussed with the patient.  Patient was feeling much better.  She was instructed to follow with her primary care physician immediately and to return to the ER if symptoms change or worsen.  Patient agreed to plan.  Carvel Raisin A.- was the Primary ED Provider for this patient.                                  Carvel Raisin A., DO 04/01/24 [phone]

## 2024-04-05 ENCOUNTER — Other Ambulatory Visit: Payer: Self-pay | Admitting: Gastroenterology

## 2024-04-06 ENCOUNTER — Telehealth: Payer: Self-pay | Admitting: Allergy

## 2024-04-06 NOTE — Telephone Encounter (Signed)
 Called patient to schedule [facility]  reapproval appointment. No voicemail set up.

## 2024-04-29 ENCOUNTER — Encounter (HOSPITAL_COMMUNITY): Payer: Self-pay | Admitting: Gastroenterology

## 2024-04-29 NOTE — Anesthesia Preprocedure Evaluation (Signed)
 Anesthesia Evaluation  Patient identified by MRN, date of birth, ID band Patient awake    Reviewed: Allergy & Precautions, NPO status , Patient's Chart, lab work & pertinent test results  History of Anesthesia Complications Negative for: history of anesthetic complications  Airway Mallampati: I  TM Distance: >3 FB Neck ROM: Full    Dental  (+) Dental Advisory Given   Pulmonary COPD,  COPD inhaler, Current Smoker and Patient abstained from smoking.   breath sounds clear to auscultation       Cardiovascular (-) angina negative cardio ROS  Rhythm:Regular Rate:Normal     Neuro/Psych negative neurological ROS     GI/Hepatic Neg liver ROS,GERD  Medicated and Controlled,,  Endo/Other  BMI 34  Renal/GU negative Renal ROS     Musculoskeletal   Abdominal   Peds  Hematology negative hematology ROS (+)   Anesthesia Other Findings Hereditary angioedema  Reproductive/Obstetrics                              Anesthesia Physical Anesthesia Plan  ASA: 2  Anesthesia Plan: MAC   Post-op Pain Management: Minimal or no pain anticipated   Induction:   PONV Risk Score and Plan: 1 and Treatment may vary due to age or medical condition  Airway Management Planned: Natural Airway and Simple Face Mask  Additional Equipment: None  Intra-op Plan:   Post-operative Plan:   Informed Consent: I have reviewed the patients History and Physical, chart, labs and discussed the procedure including the risks, benefits and alternatives for the proposed anesthesia with the patient or authorized representative who has indicated his/her understanding and acceptance.     Dental advisory given  Plan Discussed with: CRNA and Surgeon  Anesthesia Plan Comments:          Anesthesia Quick Evaluation

## 2024-04-30 ENCOUNTER — Encounter (HOSPITAL_COMMUNITY): Admission: RE | Disposition: A | Payer: Self-pay | Source: Home / Self Care | Attending: Gastroenterology

## 2024-04-30 ENCOUNTER — Ambulatory Visit (HOSPITAL_COMMUNITY): Payer: Self-pay | Admitting: Anesthesiology

## 2024-04-30 ENCOUNTER — Other Ambulatory Visit: Payer: Self-pay

## 2024-04-30 ENCOUNTER — Encounter (HOSPITAL_COMMUNITY): Payer: Self-pay | Admitting: Gastroenterology

## 2024-04-30 ENCOUNTER — Ambulatory Visit (HOSPITAL_COMMUNITY)
Admission: RE | Admit: 2024-04-30 | Discharge: 2024-04-30 | Disposition: A | Attending: Gastroenterology | Admitting: Gastroenterology

## 2024-04-30 DIAGNOSIS — Z1211 Encounter for screening for malignant neoplasm of colon: Secondary | ICD-10-CM | POA: Diagnosis not present

## 2024-04-30 DIAGNOSIS — D123 Benign neoplasm of transverse colon: Secondary | ICD-10-CM | POA: Diagnosis not present

## 2024-04-30 DIAGNOSIS — J449 Chronic obstructive pulmonary disease, unspecified: Secondary | ICD-10-CM | POA: Diagnosis not present

## 2024-04-30 DIAGNOSIS — K635 Polyp of colon: Secondary | ICD-10-CM | POA: Diagnosis not present

## 2024-04-30 DIAGNOSIS — D122 Benign neoplasm of ascending colon: Secondary | ICD-10-CM | POA: Diagnosis not present

## 2024-04-30 DIAGNOSIS — D128 Benign neoplasm of rectum: Secondary | ICD-10-CM | POA: Diagnosis not present

## 2024-04-30 DIAGNOSIS — D120 Benign neoplasm of cecum: Secondary | ICD-10-CM | POA: Diagnosis not present

## 2024-04-30 DIAGNOSIS — D125 Benign neoplasm of sigmoid colon: Secondary | ICD-10-CM | POA: Diagnosis not present

## 2024-04-30 HISTORY — PX: COLONOSCOPY: SHX5424

## 2024-04-30 HISTORY — PX: POLYPECTOMY: SHX149

## 2024-04-30 SURGERY — COLONOSCOPY
Anesthesia: Monitor Anesthesia Care

## 2024-04-30 MED ORDER — FENTANYL CITRATE (PF) 100 MCG/2ML IJ SOLN: 25.0000 ug | INTRAMUSCULAR | Status: DC | PRN

## 2024-04-30 MED ORDER — SODIUM CHLORIDE 0.9 % IV SOLN
INTRAVENOUS | Status: AC | PRN
  Administered 2024-04-30: 500 mL via INTRAVENOUS

## 2024-04-30 MED ORDER — LIDOCAINE 2% (20 MG/ML) 5 ML SYRINGE
INTRAMUSCULAR | Status: DC | PRN
  Administered 2024-04-30: 60 mg via INTRAVENOUS

## 2024-04-30 MED ORDER — MIDAZOLAM HCL (PF) 2 MG/2ML IJ SOLN: 0.5000 mg | Freq: Once | INTRAMUSCULAR | Status: DC | PRN

## 2024-04-30 MED ORDER — OXYCODONE HCL 5 MG PO TABS: 5.0000 mg | ORAL_TABLET | Freq: Once | ORAL | Status: DC | PRN

## 2024-04-30 MED ORDER — EPHEDRINE SULFATE-NACL 50-0.9 MG/10ML-% IV SOSY
PREFILLED_SYRINGE | INTRAVENOUS | Status: DC | PRN
  Administered 2024-04-30 (×2): 5 mg via INTRAVENOUS

## 2024-04-30 MED ORDER — OXYCODONE HCL 5 MG/5ML PO SOLN: 5.0000 mg | Freq: Once | ORAL | Status: DC | PRN

## 2024-04-30 MED ORDER — PROPOFOL 500 MG/50ML IV EMUL
INTRAVENOUS | Status: DC | PRN
  Administered 2024-04-30 (×2): 30 mg via INTRAVENOUS
  Administered 2024-04-30: 100 ug/kg/min via INTRAVENOUS
  Administered 2024-04-30: 40 mg via INTRAVENOUS
  Administered 2024-04-30 (×2): 20 mg via INTRAVENOUS

## 2024-04-30 MED ORDER — MEPERIDINE HCL 25 MG/ML IJ SOLN: 6.2500 mg | INTRAMUSCULAR | Status: DC | PRN

## 2024-04-30 MED ORDER — SODIUM CHLORIDE 0.9 % IV SOLN: INTRAVENOUS | Status: DC

## 2024-04-30 NOTE — Transfer of Care (Signed)
 Immediate Anesthesia Transfer of Care Note  Patient: Barbara Espinoza  Procedure(s) Performed: COLONOSCOPY POLYPECTOMY, INTESTINE  Patient Location: Endoscopy Unit  Anesthesia Type:MAC  Level of Consciousness: drowsy  Airway & Oxygen Therapy: Patient Spontanous Breathing and Patient connected to nasal cannula oxygen  Post-op Assessment: Report given to RN and Post -op Vital signs reviewed and stable  Post vital signs: Reviewed and stable  Last Vitals:  Vitals Value Taken Time  BP 103/52 04/30/24 08:10  Temp 36.3 C 04/30/24 08:10  Pulse 62 04/30/24 08:11  Resp 18 04/30/24 08:11  SpO2 100 % 04/30/24 08:11  Vitals shown include unfiled device data.  Last Pain:  Vitals:   04/30/24 0810  TempSrc: Temporal  PainSc: Asleep         Complications: No notable events documented.

## 2024-04-30 NOTE — Discharge Instructions (Signed)
YOU HAD AN ENDOSCOPIC PROCEDURE TODAY: Refer to the procedure report and other information in the discharge instructions given to you for any specific questions about what was found during the examination. If this information does not answer your questions, please call [facility] at [phone] to clarify.   YOU SHOULD EXPECT: Some feelings of bloating in the abdomen. Passage of more gas than usual. Walking can help get rid of the air that was put into your GI tract during the procedure and reduce the bloating. If you had a lower endoscopy (such as a colonoscopy or flexible sigmoidoscopy) you may notice spotting of blood in your stool or on the toilet paper. Some abdominal soreness may be present for a day or two, also.  DIET: Your first meal following the procedure should be a light meal and then it is ok to progress to your normal diet. A half-sandwich or bowl of soup is an example of a good first meal. Heavy or fried foods are harder to digest and may make you feel nauseous or bloated. Drink plenty of fluids but you should avoid alcoholic beverages for 24 hours.  ACTIVITY: Your care partner should take you home directly after the procedure. You should plan to take it easy, moving slowly for the rest of the day. You can resume normal activity the day after the procedure however YOU SHOULD NOT DRIVE, use power tools, machinery or perform tasks that involve climbing or major physical exertion for 24 hours (because of the sedation medicines used during the test).   SYMPTOMS TO REPORT IMMEDIATELY: A gastroenterologist can be reached at any hour. Please call [phone]  for any of the following symptoms:  Following lower endoscopy (colonoscopy, flexible sigmoidoscopy) Excessive amounts of blood in the stool  Significant tenderness, worsening of abdominal pains  Swelling of the abdomen that is new, acute  Fever of 100 or higher    FOLLOW UP:  If any biopsies were taken you will be  contacted by phone or by letter within the next 1-3 weeks. Call [phone]  if you have not heard about the biopsies in 3 weeks.  Please also call with any specific questions about appointments or follow up tests.  

## 2024-04-30 NOTE — Op Note (Signed)
 [facility] Patient Name: Barbara Espinoza Procedure Date : 04/30/2024 MRN: [id number] Attending MD: Belvie Just , MD, [id number] Date of Birth: [birth year] CSN: [id number] Age: 53 Admit Type: Outpatient Procedure:                Colonoscopy Indications:              Screening for colorectal malignant neoplasm Providers:                Belvie Just, MD, Ozell Pouch, [place]y [place], Technician Referring MD:              Medicines:                Propofol  per Anesthesia Complications:            No immediate complications. Estimated Blood Loss:     Estimated blood loss: none. Procedure:                Pre-Anesthesia Assessment:                           - Prior to the procedure, a History and Physical                            was performed, and patient medications and                            allergies were reviewed. The patient's tolerance of                            previous anesthesia was also reviewed. The risks                            and benefits of the procedure and the sedation                            options and risks were discussed with the patient.                            All questions were answered, and informed consent                            was obtained. Prior Anticoagulants: The patient has                            taken no anticoagulant or antiplatelet agents. ASA                            Grade Assessment: II - A patient with mild systemic                            disease. After reviewing the risks and benefits,  the patient was deemed in satisfactory condition to                            undergo the procedure.                           - Sedation was administered by an anesthesia                            professional. Deep sedation was attained.                           After obtaining informed consent, the colonoscope                            was passed under direct  vision. Throughout the                            procedure, the patient's blood pressure, pulse, and                            oxygen saturations were monitored continuously. The                            CF-HQ190L ([id number]) Olympus colonoscope was                            introduced through the anus and advanced to the the                            cecum, identified by appendiceal orifice and                            ileocecal valve. The colonoscopy was performed with                            difficulty due to significant looping. Successful                            completion of the procedure was aided by using                            manual pressure and straightening and shortening                            the scope to obtain bowel loop reduction. The                            patient tolerated the procedure well. The quality                            of the bowel preparation was evaluated using the  BBPS [facility] Bowel Preparation Scale) with scores                            of: Right Colon = 3, Transverse Colon = 3 and Left                            Colon = 3 (entire mucosa seen well with no residual                            staining, small fragments of stool or opaque                            liquid). The total BBPS score equals 9. The                            ileocecal valve, appendiceal orifice, and rectum                            were photographed. Scope In: 7:31:28 AM Scope Out: 8:05:04 AM Scope Withdrawal Time: 0 hours 20 minutes 2 seconds  Total Procedure Duration: 0 hours 33 minutes 36 seconds  Findings:      Nine sessile polyps were found in the sigmoid colon, transverse colon,       ascending colon and cecum. The polyps were 2 to 10 mm in size. These       polyps were removed with a cold snare. Resection and retrieval were       complete.      Scattered medium-mouthed and small-mouthed diverticula were found in the        sigmoid colon. Impression:               - Nine 2 to 10 mm polyps in the sigmoid colon, in                            the transverse colon, in the ascending colon and in                            the cecum, removed with a cold snare. Resected and                            retrieved.                           - Diverticulosis in the sigmoid colon. Recommendation:           - Patient has a contact number available for                            emergencies. The signs and symptoms of potential                            delayed complications were discussed with the                            patient. Return to  normal activities tomorrow.                            Written discharge instructions were provided to the                            patient.                           - Resume regular diet.                           - Continue present medications.                           - Await pathology results.                           - Repeat colonoscopy in 3 years for surveillance. Procedure Code(s):        --- Professional ---                           [phone], Colonoscopy, flexible; with removal of                            tumor(s), polyp(s), or other lesion(s) by snare                            technique Diagnosis Code(s):        --- Professional ---                           Z12.11, Encounter for screening for malignant                            neoplasm of colon                           D12.5, Benign neoplasm of sigmoid colon                           D12.3, Benign neoplasm of transverse colon (hepatic                            flexure or splenic flexure)                           D12.2, Benign neoplasm of ascending colon                           D12.0, Benign neoplasm of cecum                           K57.30, Diverticulosis of large intestine without                            perforation or abscess without bleeding CPT copyright 2022 American Medical Association. All  rights reserved. The codes documented in this report are preliminary and upon coder review may  be revised to meet current compliance requirements. Belvie Just, MD Belvie Just, MD 04/30/2024 8:13:01 AM This report has been signed electronically. Number of Addenda: 0

## 2024-04-30 NOTE — Anesthesia Postprocedure Evaluation (Signed)
 Anesthesia Post Note  Patient: Barbara Espinoza  Procedure(s) Performed: COLONOSCOPY POLYPECTOMY, INTESTINE     Patient location during evaluation: Endoscopy Anesthesia Type: MAC Level of consciousness: awake and alert, patient cooperative and oriented Pain management: pain level controlled Vital Signs Assessment: post-procedure vital signs reviewed and stable Respiratory status: nonlabored ventilation, spontaneous breathing and respiratory function stable Cardiovascular status: blood pressure returned to baseline and stable Postop Assessment: able to ambulate and no apparent nausea or vomiting Anesthetic complications: no   No notable events documented.  Last Vitals:  Vitals:   04/30/24 0820 04/30/24 0830  BP: (!) 130/91 (!) 151/85  Pulse: (!) 57 (!) 58  Resp: 10 20  Temp:    SpO2: 99% 99%    Last Pain:  Vitals:   04/30/24 0830  TempSrc:   PainSc: 0-No pain                 Wyatte Dames,E. Alver Leete

## 2024-04-30 NOTE — H&P (Signed)
 Barbara Espinoza HPI: The patient is here for a follow colonoscopy.  Her mother had colon cancer and she has a history of an SSA.  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   NO ESTROGEN PRODUCTS never confirmed   Seasonal allergies    occ wheezing due to    Past Surgical History:  Procedure Laterality Date   colonscopy  09/2017   DILATATION & CURETTAGE/HYSTEROSCOPY WITH MYOSURE N/A 10/30/2018   Procedure: DILATATION & CURETTAGE/HYSTEROSCOPY WITH MYOSURE;  Surgeon: Rockney Evalene SQUIBB, MD;  Location: [facility];  Service: Gynecology;  Laterality: N/A;  request to follow 1st case in [place] [facility]  requests one hour   NO PAST SURGERIES      Family History  Problem Relation Age of Onset   Cancer Mother        COLON CANCER--MOM W MULTIPLE MYELOMA/HTN 2010-LIVES IN [place]    Hypertension Mother    Multiple myeloma Mother    Cancer Father        stomach cancer    Social History:  reports that she has been smoking cigars. She has never used smokeless tobacco. She reports current alcohol use. She reports that she does not currently use drugs after having used the following drugs: Marijuana.  Allergies:  Allergies  Allergen Reactions   Shellfish Allergy Hives and Anaphylaxis    Reaction: wheezing  Reaction: wheezing, Reaction: wheezing  Reaction: wheezing  Reaction: wheezing   Peanut-Containing Drug Products Hives    NUTS  Reaction: wheezing  NUTS, Reaction: wheezing, NUTS, Reaction: wheezing    Medications: Scheduled: Continuous:  sodium chloride      sodium chloride  500 mL (04/30/24 0707)    No results found for this or any previous visit (from the past 24 hours).   No results found.  ROS:  As stated above in the HPI otherwise negative.  Blood pressure 135/79, pulse 62, temperature 97.7 F (36.5 C),  resp. rate 16, height 5' 4 (1.626 m), weight 90.7 kg, last menstrual period 10/24/2018, SpO2 100%.    PE: Gen: NAD, Alert and Oriented HEENT:  [place]/AT, EOMI Neck: Supple, no LAD Lungs: CTA Bilaterally CV: RRR without M/G/R ABD: Soft, NTND, +BS Ext: No C/C/E  Assessment/Plan: 1) Screening colonoscopy.  Abisai Coble D 04/30/2024, 7:17 AM

## 2024-04-30 NOTE — Anesthesia Procedure Notes (Signed)
 Procedure Name: MAC Date/Time: 04/30/2024 7:26 AM  Performed by: Claudene Arlin LABOR, CRNAPre-anesthesia Checklist: Patient identified, Emergency Drugs available, Suction available and Patient being monitored Patient Re-evaluated:Patient Re-evaluated prior to induction Oxygen Delivery Method: Nasal cannula Induction Type: IV induction Placement Confirmation: positive ETCO2 Dental Injury: Teeth and Oropharynx as per pre-operative assessment

## 2024-05-01 LAB — SURGICAL PATHOLOGY

## 2024-05-02 ENCOUNTER — Encounter (HOSPITAL_COMMUNITY): Payer: Self-pay | Admitting: Gastroenterology

## 2024-05-07 DIAGNOSIS — R0600 Dyspnea, unspecified: Secondary | ICD-10-CM | POA: Diagnosis not present

## 2024-05-07 DIAGNOSIS — J45901 Unspecified asthma with (acute) exacerbation: Secondary | ICD-10-CM | POA: Diagnosis not present

## 2024-06-14 ENCOUNTER — Encounter: Payer: Self-pay | Admitting: Allergy
